# Patient Record
Sex: Male | Born: 1955 | Race: Black or African American | Hispanic: No | Marital: Married | Smoking: Former smoker
Health system: Southern US, Community
[De-identification: ages and names within clinical notes are randomized; demographics above are authoritative.]

## PROBLEM LIST (undated history)

## (undated) DIAGNOSIS — E119 Type 2 diabetes mellitus without complications: Secondary | ICD-10-CM

## (undated) DIAGNOSIS — E785 Hyperlipidemia, unspecified: Secondary | ICD-10-CM

## (undated) DIAGNOSIS — I10 Essential (primary) hypertension: Secondary | ICD-10-CM

## (undated) DIAGNOSIS — I252 Old myocardial infarction: Secondary | ICD-10-CM

## (undated) DIAGNOSIS — I2510 Atherosclerotic heart disease of native coronary artery without angina pectoris: Secondary | ICD-10-CM

## (undated) DIAGNOSIS — Z8739 Personal history of other diseases of the musculoskeletal system and connective tissue: Secondary | ICD-10-CM

## (undated) HISTORY — DX: Hyperlipidemia, unspecified: E78.5

## (undated) HISTORY — DX: Old myocardial infarction: I25.2

## (undated) HISTORY — PX: CARDIAC CATHETERIZATION: SHX172

## (undated) HISTORY — DX: Atherosclerotic heart disease of native coronary artery without angina pectoris: I25.10

## (undated) HISTORY — DX: Essential (primary) hypertension: I10

---

## 1999-05-20 ENCOUNTER — Inpatient Hospital Stay (HOSPITAL_COMMUNITY): Admission: AD | Admit: 1999-05-20 | Discharge: 1999-05-23 | Payer: Self-pay | Admitting: *Deleted

## 2001-04-09 ENCOUNTER — Ambulatory Visit (HOSPITAL_COMMUNITY): Admission: RE | Admit: 2001-04-09 | Discharge: 2001-04-10 | Payer: Self-pay | Admitting: *Deleted

## 2001-04-09 ENCOUNTER — Encounter: Payer: Self-pay | Admitting: *Deleted

## 2001-06-20 ENCOUNTER — Ambulatory Visit (HOSPITAL_COMMUNITY): Admission: RE | Admit: 2001-06-20 | Discharge: 2001-06-21 | Payer: Self-pay | Admitting: *Deleted

## 2001-07-21 ENCOUNTER — Observation Stay (HOSPITAL_COMMUNITY): Admission: EM | Admit: 2001-07-21 | Discharge: 2001-07-22 | Payer: Self-pay | Admitting: Emergency Medicine

## 2001-07-21 ENCOUNTER — Encounter: Payer: Self-pay | Admitting: Cardiology

## 2001-07-22 ENCOUNTER — Encounter: Payer: Self-pay | Admitting: Cardiology

## 2002-12-30 ENCOUNTER — Encounter: Payer: Self-pay | Admitting: *Deleted

## 2002-12-30 ENCOUNTER — Emergency Department (HOSPITAL_COMMUNITY): Admission: EM | Admit: 2002-12-30 | Discharge: 2002-12-30 | Payer: Self-pay | Admitting: *Deleted

## 2003-07-11 ENCOUNTER — Ambulatory Visit: Admission: RE | Admit: 2003-07-11 | Discharge: 2003-07-11 | Payer: Self-pay | Admitting: Cardiology

## 2003-12-07 ENCOUNTER — Ambulatory Visit (HOSPITAL_COMMUNITY): Admission: RE | Admit: 2003-12-07 | Discharge: 2003-12-07 | Payer: Self-pay | Admitting: Family Medicine

## 2004-02-08 ENCOUNTER — Ambulatory Visit (HOSPITAL_COMMUNITY): Admission: RE | Admit: 2004-02-08 | Discharge: 2004-02-08 | Payer: Self-pay | Admitting: Family Medicine

## 2004-05-30 ENCOUNTER — Emergency Department (HOSPITAL_COMMUNITY): Admission: EM | Admit: 2004-05-30 | Discharge: 2004-05-30 | Payer: Self-pay | Admitting: Emergency Medicine

## 2004-06-04 ENCOUNTER — Emergency Department (HOSPITAL_COMMUNITY): Admission: EM | Admit: 2004-06-04 | Discharge: 2004-06-04 | Payer: Self-pay | Admitting: Emergency Medicine

## 2005-04-06 ENCOUNTER — Encounter (HOSPITAL_COMMUNITY): Admission: RE | Admit: 2005-04-06 | Discharge: 2005-05-06 | Payer: Self-pay | Admitting: Orthopedic Surgery

## 2005-08-03 ENCOUNTER — Ambulatory Visit: Payer: Self-pay | Admitting: Cardiology

## 2005-10-16 ENCOUNTER — Ambulatory Visit: Payer: Self-pay | Admitting: Cardiology

## 2006-08-14 ENCOUNTER — Ambulatory Visit: Payer: Self-pay | Admitting: Cardiology

## 2006-08-18 ENCOUNTER — Emergency Department (HOSPITAL_COMMUNITY): Admission: EM | Admit: 2006-08-18 | Discharge: 2006-08-18 | Payer: Self-pay | Admitting: Emergency Medicine

## 2006-08-21 ENCOUNTER — Ambulatory Visit: Payer: Self-pay | Admitting: Cardiology

## 2006-08-21 ENCOUNTER — Ambulatory Visit: Payer: Self-pay

## 2007-08-04 ENCOUNTER — Ambulatory Visit: Payer: Self-pay | Admitting: Cardiology

## 2007-09-15 ENCOUNTER — Ambulatory Visit: Payer: Self-pay | Admitting: Cardiology

## 2007-09-15 LAB — CONVERTED CEMR LAB
AST: 31 units/L (ref 0–37)
Albumin: 4.1 g/dL (ref 3.5–5.2)
Alkaline Phosphatase: 49 units/L (ref 39–117)
Cholesterol: 199 mg/dL (ref 0–200)
Direct LDL: 123.6 mg/dL
Total Protein: 7.5 g/dL (ref 6.0–8.3)
VLDL: 45 mg/dL — ABNORMAL HIGH (ref 0–40)

## 2008-08-02 ENCOUNTER — Ambulatory Visit: Payer: Self-pay | Admitting: Cardiology

## 2008-08-25 ENCOUNTER — Ambulatory Visit: Payer: Self-pay | Admitting: Cardiology

## 2008-08-25 ENCOUNTER — Ambulatory Visit: Payer: Self-pay

## 2008-08-25 LAB — CONVERTED CEMR LAB
Alkaline Phosphatase: 42 units/L (ref 39–117)
BUN: 14 mg/dL (ref 6–23)
Bilirubin, Direct: 0.1 mg/dL (ref 0.0–0.3)
CO2: 26 meq/L (ref 19–32)
Direct LDL: 112.6 mg/dL
GFR calc non Af Amer: 95 mL/min
Glucose, Bld: 103 mg/dL — ABNORMAL HIGH (ref 70–99)
HDL: 36.6 mg/dL — ABNORMAL LOW (ref >39.0)
Total Bilirubin: 0.8 mg/dL (ref 0.3–1.2)
Total CHOL/HDL Ratio: 6.2
VLDL: 49 mg/dL — ABNORMAL HIGH (ref 0–40)

## 2009-02-01 ENCOUNTER — Ambulatory Visit: Payer: Self-pay | Admitting: Cardiology

## 2009-02-01 DIAGNOSIS — E785 Hyperlipidemia, unspecified: Secondary | ICD-10-CM

## 2009-02-01 DIAGNOSIS — I10 Essential (primary) hypertension: Secondary | ICD-10-CM

## 2009-02-01 DIAGNOSIS — I2510 Atherosclerotic heart disease of native coronary artery without angina pectoris: Secondary | ICD-10-CM

## 2009-02-03 ENCOUNTER — Ambulatory Visit: Payer: Self-pay

## 2009-03-14 ENCOUNTER — Ambulatory Visit: Payer: Self-pay | Admitting: Cardiology

## 2009-03-14 ENCOUNTER — Encounter: Payer: Self-pay | Admitting: Cardiology

## 2009-10-15 ENCOUNTER — Emergency Department (HOSPITAL_COMMUNITY): Admission: EM | Admit: 2009-10-15 | Discharge: 2009-10-15 | Payer: Self-pay | Admitting: Emergency Medicine

## 2010-02-28 ENCOUNTER — Ambulatory Visit: Payer: Self-pay | Admitting: Cardiology

## 2010-02-28 DIAGNOSIS — I252 Old myocardial infarction: Secondary | ICD-10-CM | POA: Insufficient documentation

## 2011-01-30 NOTE — Assessment & Plan Note (Signed)
Summary: CAD/ANAS      Allergies Added: NKDA  Visit Type:  1 YR F/U Primary Provider:  Dr. Loleta Chance  CC:  no cardaic complaints today.  History of Present Illness: Tony Cannon comes in today for further evaluation and his coronary disease.  Examining her symptoms of angina or ischemia. Denies orthopnea, PND or peripheral edema. He is compliant with his medications. His weight continues to be a problem and he says he needs to lose 50 pounds.  His blood work followed by Dr. Loleta Chance his primary care.  He is not carry nitroglycerin.  Current Medications (verified): 1)  Aspirin Ec 325 Mg Tbec (Aspirin) .... Take One Tablet By Mouth Daily 2)  Metoprolol Succinate 25 Mg Xr24h-Tab (Metoprolol Succinate) .... 1/2 Tab Once Daily 3)  Lisinopril 40 Mg Tabs (Lisinopril) .[name].. 1 Tab Once Daily 4)  Lipitor 80 Mg Tabs (Atorvastatin Calcium) .[name].. 1 Tab Once Daily 5)  Zetia 10 Mg Tabs (Ezetimibe) .[name].. 1 Tab Once Daily 6)  Nitroglycerin 0.4 Mg Subl (Nitroglycerin) .... One Tablet Under Tongue Every 5 Minutes As Needed For Chest Pain---May Repeat Times Three  Allergies (verified): No Known Drug Allergies  Past History:  Past Medical History: Last updated: 02/01/2009 Current Problems:  HYPERTENSION, BENIGN (ICD-401.1) HYPERLIPIDEMIA-MIXED (ICD-272.4) CAD, NATIVE VESSEL (ICD-414.01)  Social History: Last updated: 02/01/2009 Retired  Married  Tobacco Use - Former.  Alcohol Use - yes  Risk Factors: Smoking Status: quit (02/01/2009)  Review of Systems       negative other than history of present illness  Vital Signs:  Patient profile:   55 year old male Height:      72 inches Weight:      269 pounds BMI:     36.61 Pulse rate:   66 / minute Pulse rhythm:   irregular BP sitting:   138 / 80  (left arm) Cuff size:   large  Vitals Entered By: Danielle Rankin, CMA (February 28, 2010 3:48 PM)  Physical Exam  General:  obese.  obese.   Head:  normocephalic and atraumatic Eyes:  PERRLA/EOM  intact; conjunctiva and lids normal. Neck:  Neck supple, no JVD. No masses, thyromegaly or abnormal cervical nodes. Chest [name]:  no deformities or breast masses noted Lungs:  Clear bilaterally to auscultation and percussion. Heart:  Ppoorly appreciated, normal S1-S2, no gallop Abdomen:  Bowel sounds positive; abdomen soft and non-tender without masses, organomegaly, or hernias noted. No hepatosplenomegaly. Msk:  Back normal, normal gait. Muscle strength and tone normal. Pulses:  pulses normal in all 4 extremities Extremities:  No clubbing or cyanosis. Neurologic:  Alert and oriented x 3. Skin:  Intact without lesions or rashes. Psych:  Normal affect.   EKG  Procedure date:  02/28/2010  Findings:      normal sinus rhythm, old inferior [name] infarct, stable  Impression & Recommendations:  Problem # 1:  CAD, NATIVE VESSEL (ICD-414.01) Assessment Unchanged  His updated medication list for this problem includes:    Aspirin Ec 325 Mg Tbec (Aspirin) .[name]... Take one tablet by mouth daily    Metoprolol Succinate 25 Mg Xr24h-tab (Metoprolol succinate) .[name]... 1/2 tab once daily    Lisinopril 40 Mg Tabs (Lisinopril) .[name]... 1 tab once daily    Nitroglycerin 0.4 Mg Subl (Nitroglycerin) ..... One tablet under tongue every 5 minutes as needed for chest pain---may repeat times three  Orders: EKG w/ Interpretation (93000)  Problem # 2:  HYPERTENSION, BENIGN (ICD-401.1) Assessment: Unchanged  His updated medication list for this problem  includes:    Aspirin Ec 325 Mg Tbec (Aspirin) .[name]... Take one tablet by mouth daily    Metoprolol Succinate 25 Mg Xr24h-tab (Metoprolol succinate) .[name]... 1/2 tab once daily    Lisinopril 40 Mg Tabs (Lisinopril) .[name]... 1 tab once daily  Problem # 3:  HYPERLIPIDEMIA-MIXED (ICD-272.4)  His updated medication list for this problem includes:    Lipitor 80 Mg Tabs (Atorvastatin calcium) .[name]... 1 tab once daily    Zetia 10 Mg Tabs (Ezetimibe) .[name]... 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 Take one tablet by mouth daily    Metoprolol Succinate 25 Mg Xr24h-tab (Metoprolol succinate) .[name]... 1/2 tab once daily    Lisinopril 40 Mg Tabs (Lisinopril) .[name]... 1 tab once daily    Nitroglycerin 0.4 Mg Subl (Nitroglycerin) ..... One tablet under tongue every 5 minutes as needed for chest pain---may repeat times three  His updated medication list for this problem includes:    Aspirin Ec 325 Mg Tbec (Aspirin) .[name]... Take one tablet by mouth daily    Metoprolol Succinate 25 Mg Xr24h-tab (Metoprolol succinate) .[name]... 1/2 tab once daily    Lisinopril 40 Mg Tabs (Lisinopril) .[name]... 1 tab once daily    Nitroglycerin 0.4 Mg Subl (Nitroglycerin) ..... One tablet under tongue every 5 minutes as needed for chest pain---may repeat times three  Patient Instructions: 1)  Your physician recommends that you schedule a follow-up appointment in: YEAR WITH DR Cassie Henkels 2)  Your physician recommends that you continue on your current medications as directed. Please refer to the Current Medication list given to you today. Prescriptions: NITROGLYCERIN 0.4 MG SUBL (NITROGLYCERIN) One tablet under tongue every 5 minutes as needed for chest pain---may repeat times three  #25 x 4   Entered by:   Scherrie Bateman, LPN   Authorized by:   Gaylord Shih, MD, [facility]   Signed by:   Scherrie Bateman, LPN on 22/01/5426   Method used:   Electronically to        [facility] (retail)       [address]       [place], [place]  [address]       Ph: [phone]       Fax: [phone]   RxID:   [id number]

## 2011-04-05 ENCOUNTER — Encounter: Payer: Self-pay | Admitting: *Deleted

## 2011-04-09 ENCOUNTER — Ambulatory Visit: Payer: Self-pay | Admitting: Cardiology

## 2011-05-08 ENCOUNTER — Encounter: Payer: Self-pay | Admitting: *Deleted

## 2011-05-10 ENCOUNTER — Ambulatory Visit (INDEPENDENT_AMBULATORY_CARE_PROVIDER_SITE_OTHER): Payer: BC Managed Care – PPO | Admitting: Cardiology

## 2011-05-10 ENCOUNTER — Encounter: Payer: Self-pay | Admitting: Cardiology

## 2011-05-10 VITALS — BP 128/80 | HR 58 | Resp 14 | Ht 72.0 in | Wt 273.0 lb

## 2011-05-10 DIAGNOSIS — I10 Essential (primary) hypertension: Secondary | ICD-10-CM

## 2011-05-10 DIAGNOSIS — Z13220 Encounter for screening for lipoid disorders: Secondary | ICD-10-CM

## 2011-05-10 DIAGNOSIS — I2510 Atherosclerotic heart disease of native coronary artery without angina pectoris: Secondary | ICD-10-CM

## 2011-05-10 DIAGNOSIS — I252 Old myocardial infarction: Secondary | ICD-10-CM

## 2011-05-10 DIAGNOSIS — E785 Hyperlipidemia, unspecified: Secondary | ICD-10-CM

## 2011-05-10 LAB — BASIC METABOLIC PANEL
BUN: 12 mg/dL (ref 6–23)
Chloride: 104 mEq/L (ref 96–112)
Potassium: 4.3 mEq/L (ref 3.5–5.1)
Sodium: 138 mEq/L (ref 135–145)

## 2011-05-10 LAB — HEPATIC FUNCTION PANEL
Alkaline Phosphatase: 49 U/L (ref 39–117)
Bilirubin, Direct: 0 mg/dL (ref 0.0–0.3)
Total Protein: 7.2 g/dL (ref 6.0–8.3)

## 2011-05-10 LAB — LIPID PANEL
Cholesterol: 206 mg/dL — ABNORMAL HIGH (ref 0–200)
Triglycerides: 258 mg/dL — ABNORMAL HIGH (ref 0.0–149.0)

## 2011-05-10 MED ORDER — NITROGLYCERIN 0.4 MG SL SUBL: 0.4000 mg | SUBLINGUAL_TABLET | SUBLINGUAL | Status: DC | PRN

## 2011-05-10 NOTE — Assessment & Plan Note (Signed)
Stable, continue medical therapy. Check lipids today. NTG renewed with education on how to use it.

## 2011-05-10 NOTE — Patient Instructions (Signed)
Your physician recommends that you have lab work today: lipid,liver,bmp Your physician recommends that you schedule a follow-up appointment in:  1 year with Dr. Daleen Squibb

## 2011-05-10 NOTE — Assessment & Plan Note (Signed)
Check lipids today 

## 2011-05-10 NOTE — Assessment & Plan Note (Signed)
Improved. Decrease weight.

## 2011-05-10 NOTE — Progress Notes (Signed)
   Patient ID: Tony Cannon, male    DOB: 1956-02-22, 55 y.o.   MRN: [id number]  HPI Tony Cannon returns today for E and M of his CAD, HL, and HTN. He denies any chest pain or angina. He has occasional indigestion. Weight has been stable, has not lost any. Has not had lipids checked by Tony Cannon because he was not taking his meds when he saw him last. He is out of SL NTG.  EKG today shows NSR with old IMI and occasional PVC.    Review of Systems  All other systems reviewed and are negative.      Physical Exam  Nursing note and vitals reviewed. Constitutional: He is oriented to person, place, and time. He appears well-nourished. No distress.       obese  HENT:  Head: Normocephalic and atraumatic.  Eyes: EOM are normal. Pupils are equal, round, and reactive to light.  Neck: No tracheal deviation present. No thyromegaly present.  Cardiovascular: Normal rate, regular rhythm, S1 normal, S2 normal, normal heart sounds and normal pulses.  PMI is not displaced.     No systolic murmur is present   No diastolic murmur is present  Pulmonary/Chest: Effort normal.  Abdominal: Soft. Bowel sounds are normal. He exhibits no distension. There is no tenderness.  Musculoskeletal: Normal range of motion. He exhibits no edema.  Neurological: He is alert and oriented to person, place, and time.  Skin: Skin is warm and dry.  Psychiatric: He has a normal mood and affect.

## 2011-05-15 NOTE — Assessment & Plan Note (Signed)
[facility]                            CARDIOLOGY OFFICE NOTE   Cannon, Tony Cannon                     MRN:          [id number]  DATE:03/14/2009                            DOB:          Jul 09, 1956    Tony Cannon comes in today for followup.  He was seen in the office by  Tony Cannon on February 01, 2009.  He had some chest discomfort while  shoveling snow and pushing his car.   He had a stress Myoview on February 03, 2009.  This showed an old  inferior wall infarct.  No ischemia, EF 46%.  He has some mild peri-  infarct ischemia but low-risk study.   He has had no further pain.   CURRENT MEDICATIONS:  1. Enteric-coated aspirin 325 mg a day.  2. Toprol-XL 12.5 mg a day.  3. Lisinopril 40 mg a day.  4. Lipitor 80 mg a day.  5. Zetia 10 mg a day.  6. Nitroglycerin p.r.n.   PHYSICAL EXAMINATION:  VITAL SIGNS:  His blood pressure is 130/80 in the  left arm.  His pulse is 72 and regular.  He is 72 inches tall, 262  pounds.  BMI is 35.6.  HEENT:  Normal.  Sclerae are muddy.  NECK:  Supple.  Carotid upstrokes were equal bilaterally without bruits,  no JVD.  Thyroid is not enlarged.  Trachea is midline.  CHEST:  Lungs  are clear to auscultation and percussion.  HEART:  Poorly-appreciated PMI.  Normal S1 and S2.  ABDOMEN:  Protuberant, good bowel sounds.  Soft.  No organomegaly.  It  was difficult to assess.  EXTREMITIES:  No edema.  Pulses were present.  NEURO:  Intact.   ASSESSMENT AND PLAN:  Tony Cannon is stable from our standpoint.  We will  see him back in a year.  I have made no changes in his medical program.     Tony Cannon. Tony Squibb, MD, [facility]  Electronically Signed    TCW/MedQ  DD: 03/14/2009  DT: 03/15/2009  Job #: [id number]   cc:   Tony Cannon. Tony Chance, MD

## 2011-05-15 NOTE — Assessment & Plan Note (Signed)
[facility]                            CARDIOLOGY OFFICE NOTE   NAME:Weedon, NATHANEAL SOMMERS                     MRN:          [id number]  DATE:08/02/2008                            DOB:          May 20, 1956    [name] returns today for further management of the following issues:  1. Coronary artery disease.  He had a previous inferior wall infarct,      where he had heavy pressure on his chest and back and nausea.  He      had a stent placed to the distal right coronary artery on May 28, 1999.  He then had progressive substernal chest pain and      subsequently had stenting of the mid LAD as well as to mid      circumflex.  This was achieved on April 09, 2001.  He then had PTA      and stenting of the distal right.  He is having no angina or      ischemic symptoms.  His last stress Myoview was on August 21, 2006,      showed him to have good exercise tolerance, EF 48%, no ischemia or      inferior wall infarct.  2. Hyperlipidemia, on high-dose Lipitor and Zetia.  His lipids have      not been at goal.  Last year, he was noncompliant with his Zetia.  3. Hypertension.   He is being followed by Dr. Mirna Mires.  He states it has been some  time since he has had blood work.   His current meds are:  1. Enteric-coated aspirin 325 mg a day.  2. Generic Toprol 12.5 mg a day.  3. Lisinopril 40 mg a day.  4. Lipitor 80 mg a day.  5. Zetia 10 mg a day.   PHYSICAL EXAMINATION:  VITAL SIGNS:  His blood pressure today is 125/65.  His pulse is 68 and regular.  His weight is 260.  HEENT:  No change.  NECK:  Carotid upstrokes are equal bilaterally without bruits.  No JVD.  Thyroid is not enlarged.  Trachea is midline.  Neck is supple.  LUNGS:  Clear.  HEART:  Reveals a normal S1 and S2.  Poorly appreciated PMI.  No murmur.  ABDOMEN:  Soft.  Good bowel sounds.  No obvious midline bruit.  No  hepatomegalia.  EXTREMITIES:  No cyanosis, clubbing, or edema.   Pulses are 2+/4+, both  dorsalis pedis and posterior tibial.  NEURO:  Intact.  MUSCULOSKELETAL:  Intact.  SKIN:  Unremarkable.   EKG shows sinus bradycardia at a rate of 56 beats per minute with  inferior Q waves and there has been no change.   I had a nice chat with Mr. Villalpando today.  I have renewed his medications  and set him up for an exercise rest stress Myoview, off his Toprol.  I  have written a note saying that if this is stable, he  can exercise at [facility].  We will check fasting lipids and  comprehensive metabolic panel.  I will  see him back in a year otherwise.     Thomas C. Daleen Squibb, MD, [facility]  Electronically Signed    TCW/MedQ  DD: 08/02/2008  DT: 08/03/2008  Job #: [id number]   cc:   Annia Friendly. Loleta Chance, MD

## 2011-05-15 NOTE — Assessment & Plan Note (Signed)
[facility]                            CARDIOLOGY OFFICE NOTE   NAME:Tony Cannon, Tony Cannon                     MRN:          [id number]  DATE:02/01/2009                            DOB:          [birth year]    PRIMARY CARDIOLOGIST:  Tony Sans. Daleen Squibb, MD, [facility]   PRIMARY CARE PHYSICIAN:  Tony Friendly. Hill, MD   HISTORY OF PRESENT ILLNESS:  Tony Cannon is a very pleasant 55 year old  African American male patient who is added on today for chest pain.  He  has a history of coronary artery disease status post inferior wall MI,  treated with stent to the RCA on May 28, 1999.  He then had stenting to  the mid LAD as well as the mid circumflex in 2002.  He had followed up  by PTA and stenting to the distal RCA.  His last stress test was in  August 2009 in which he exercised 9 minutes 15 seconds with the Bruce  protocol.  He had a primary fixed inferior defect with minimal ischemia  in the apical inferior wall similar to prior study, excellent exercise  capacity with no chest pain.  EF was 51%.   The patient states that yesterday he was shoveling snow and then pushed  a car with one of his arms.  He was fine while doing the exertion but  when he got in his car to drive away, he developed an uncomfortable  sensation in his left chest associated with belching, this lasted off  and on all day but was relieved with 2 Pepcid AC last evening.  He had  some more this morning, but mostly belching and not as much of the chest  pain.  He says it feels nothing like when he had his MI or  interventions, but he is very vague about describing his chest pain.  He  denies that it is pressure or heaviness, burning or sharp pain.  He has  no pain with exertion.  He slept fine last night without awakening with  pain.  He does not feel the need to use nitroglycerin.  The patient  states the only reason he is here is that his wife insisted that he  come.   CURRENT MEDICATIONS:  1.  Aspirin 325 mg daily.  2. Toprol 12.5 mg daily.  3. Lisinopril 40 mg daily.  4. Lipitor 80 mg daily.  5. Zetia 10 mg daily.   PHYSICAL EXAM:  GENERAL:  This is a very pleasant 55 year old African  American male in no acute distress.  VITAL SIGNS:  Blood pressure 125/81, pulse 60, weight 267.  NECK:  Without JVD, HJR, bruit, or thyroid enlargement.  LUNGS:  Clear anterior, posterior, and lateral.  HEART:  Regular rate and rhythm with 60 beats per minute.  Normal S1 and  S2.  No murmur, rub, bruit, thrill, or heaves noted.  He is not  sensitive or sore to palpation.  ABDOMEN:  Obese.  Normoactive bowel sounds are heard throughout.  No  organomegaly, masses, lesions, or abnormal tenderness.  EXTREMITIES:  Without cyanosis, clubbing, or edema.  He has good distal pulses.   EKG normal sinus rhythm with inferior Q waves, incomplete right bundle-  branch block.  No acute change from prior tracings.   IMPRESSION:  1. Chest pain after shoveling and pushing the car out of the snow      yesterday associated with belching.  2. Coronary artery disease status post inferior wall myocardial      infarction treated with stenting to the distal right coronary      artery in May 2000.  3. Stenting to the mid left anterior descending artery and mid      circumflex in April 2002.  4. Percutaneous transluminal angioplasty and stenting to the distal      right coronary artery in June 2002.  Stress Myoview in August 2009      showed fixed inferior defect with minimal ischemia in the apical      inferior wall similar to prior study, ejection fraction 51%.  5. Hyperlipidemia.  6. Hypertension.   PLAN:  I had a long discussion with Tony Cannon concerning his chest  pain.  I did offer that he could to go to the hospital for further  evaluation or have an outpatient stress Myoview.  The patient prefers to  go this route.  I asked him that if he has any chest pressure or  tightness similar to his prior MI  that he go directly to the emergency  room.  He has fresh nitroglycerin with him and we will schedule him for  the stress test in the next day or so.  I suspect his pain is most  probably related to his shoveling and pushing the car out of the ditch,  musculoskeletal.  I told him to continue taking the Pepcid for the  belching and indigestion, and he will follow up with Dr. Daleen Cannon in the  next couple of weeks.  He is to call if he has any further problems.      [name], PA-C  Electronically Signed      [name], MD, [facility]  Electronically Signed   ML/MedQ  DD: 02/01/2009  DT: 02/02/2009  Job #: [id number]

## 2011-05-15 NOTE — Assessment & Plan Note (Signed)
[facility]                            CARDIOLOGY OFFICE NOTE   NAME:Tony Cannon, Tony Cannon                     MRN:          [id number]  DATE:08/04/2007                            DOB:          03/03/1956    Tony Cannon returns today for further management of the following issues:  1. Coronary artery disease.  He had a negative stress Myoview August 21, 2006.  He has had previous intervention of the right coronary      artery, and a previous inferior wall infarct.  He is having no      symptoms of angina or ischemia.  2. Mild reduction of left ventricular systolic function.  3. Hypertension.  4. Hyperlipidemia.  We switched him back to Lipitor and Zetia because      he did not have a good response to Vytorin.  He is due lipids.   MEDICATIONS:  1. Aspirin 325 a day.  2. Toprol generic 12.5 mg a day.  3. Lisinopril 40 mg a day.  4. Lipitor 80 mg a day.   He is now off Zetia.   His blood pressure is 122/80.  His pulse is 70 and regular.  His weight  is 257.  HEENT:  Normocephalic and atraumatic.  PERRLA.  Extraocular movements  intact.  Sclerae are clear.  Facial symmetry is normal.  NECK:  Shows good carotid upstroke.  There is no bruit.  Thyroid is not  enlarged.  Trachea is midline.  LUNGS:  Clear.  HEART:  Reveals a regular rate and rhythm, non-displaced, but poorly  appreciated PMI.  ABDOMINAL EXAM:  Protuberant with good bowel sounds.  No obvious  tenderness.  EXTREMITIES:  Reveal no cyanosis, clubbing, or edema.  Pulses are brisk.  NEUROLOGIC:  Exam is intact.   Electrocardiogram showed sinus rhythm with an old inferior wall infarct  pattern.  There has been no change.   ASSESSMENT:  Tony Cannon is doing well.  He is asymptomatic.  He is due  followup lipids and LFTs.  Unfortunately, he is off his Zetia.   PLAN:  1. Renew Zetia 10 mg a day.  2. Continue Lipitor 80 mg a day.  3. Continue other medications.  4. Fasting lipids and  LFTs in about 6 to 8 weeks.  5. Follow up with me in a year.     Thomas C. Daleen Squibb, MD, [facility]  Electronically Signed    TCW/MedQ  DD: 08/04/2007  DT: 08/04/2007  Job #: [id number]   cc:   Annia Friendly. Loleta Chance, MD

## 2011-05-18 NOTE — Cardiovascular Report (Signed)
[place]  Patient:    Tony Cannon, Tony Cannon                    MRN: [id number] Adm. Date:  04/09/01 Attending:  Cecil Cranker, M.D. [facility] CC:         Mirna Mires, M.D.  Daisey Must, M.D. [facility]   Cardiac Catheterization  PROCEDURE:  Selective coronary angiography, left ventricular angiography, and abdominal aortography, LIMA angiography - Judkins technique.  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  He presented in May of 2000 with acute DMI and emergency PTCA stent of the totally occluded RCA.  He now presented approximately two to three weeks ago.  RESULTS:  Pressures; LV systolic 120, diastolic 16.  AO systolic 120, diastolic 80.  ANGIOGRAPHY:  The left main coronary artery is normal.  The left anterior descending has a focal 90 to 95% lesion before the first septal and diagonal.  The circumflex has a 90% focal lesion between the first and second obtuse marginals.  The RCA has 50% segmental lesion proximal and the stent is noted distally with no restenosis.  In the large posterolateral branch there is a 50% lesion and an 80% segmental lesion.  The left ventricle reveals normal EF with slight focal hypokinesis inferior basal segment.  Abdominal aortogram reveals normal renal arteries and no aneurysms.  The LIMA is patent.  The patient has three vessel disease with focal lesions of the LAD and circumflex and somewhat segmental in the posterolateral branch of the RCA. The LV is well preserved.  I have reviewed the patient with Dr. Chales Abrahams.  It is felt that because of his young age and the focality of these lesions that percutaneous intervention would be indicated. DD:  04/09/01 TD:  04/09/01 Job: [id number]

## 2011-05-18 NOTE — Discharge Summary (Signed)
Tony Cannon. [facility]  Patient:    Tony Cannon                       MRN: [id number] Adm. Date:  04/09/01 Disc. Date: 04/10/01 Attending:  [name], M.D. [facility] Dictator:   Gene Serpe, P.A. CC:         Annia Friendly. Loleta Chance, M.D., [place], [place]   Referring Physician Discharge [place]  PROCEDURE:  Stenting of left anterior descending and aortic valve/circumflex, April 09, 2001.  REASON FOR ADMISSION:  [name] is a 55 year old male with a prior history of inferior MI/stent, RCA, and PTCA, acute marginal, May 2000, who is followed by Dr. Loraine Leriche Pulsipher, and who presented to our office recently with exertional chest discomfort.  Recommendation was to proceed with diagnostic coronary angiogram to exclude CAD progression.  Please refer to dictated admission note for full details.  LABORATORY DATA:  Normal CBC at discharge.  Sodium 139, potassium 3.5, glucose 119, BUN 12, creatinine 1.2 at discharge.  Admission EKG:  Sinus bradycardia, 54 beats per minute, no ischemic changes.  Admission CXR:  NAD.  HOSPITAL COURSE:  Patient was admitted for elective coronary angiography, performed on April 09, 2001 by Dr. Cecil Cranker (see cath report for full details), which revealed a severe two-vessel CAD and normal LV function. Specifically, the LMCA was normal and the LIMA was patent; there was 95% focal proximal LAD; 90% focal CFX between OM-1 and OM-3; 50% proximal RCA with no restenosis at prior stent RCA site; 80% PL.  LV function was normal.  Following review of films with Dr. Veneda Melter, Dr. Chales Abrahams proceeded with successful stenting of the 90% mid LAD lesion and the 90% mid AV/CFX lesion, both to 0% residual stenosis.  No noted complications.  FINAL RECOMMENDATIONS:  Plavix x 6 weeks with consideration of elective PCI of the RCA lesion in 4-6 weeks by Dr. Loraine Leriche Pulsipher.  MEDICATION ADJUSTMENTS:  Addition of Plavix and Altace.  Of note,  initial intention was to have increased Toprol to 25 mg q.d. when patient was seen in the office; however, he has continued on 12.5 mg and we will continue at this dose, given that he is being started on Altace here in the hospital.  MEDICATIONS AT DISCHARGE: 1. Plavix 75 mg q.d. (x 6 weeks). 2. Altace 2.5 mg q.d. (x 1 week), then 5 mg q.d. (x 3 weeks), then 10 mg q.d. 3. Aspirin 325 mg q.d. 4. Toprol-XL 12.5 mg q.d. 5. Lipitor 40 mg q.d. 6. Nitrostat as directed.  INSTRUCTIONS:  Refrain from any strenuous activity, heavy lifting or driving x 2 days; maintain low-fat/-cholesterol diet; call the office if there is any swelling/bleeding of groin.  Patient will follow up with Dian Queen, P.A.C. on Monday, April 22nd, at noon.  He will also need to be seen by Dr. Gerri Spore regarding plans to proceed with an elective PCI of the RCA as previously outlined.  DISCHARGE DIAGNOSES: 1. Coronary artery disease progression.    a. Status post stent, left anterior descending and aortic valve/circumflex,       April 09, 2001.    b. Residual 50% proximal right coronary artery; no restenosis at stent       right coronary artery site.    c. Normal left ventricular function.    d. History of acute inferior myocardial infarction/stent, distal right       coronary artery; percutaneous transluminal coronary angioplasty, acute  marginal, May 2000. 2. History of sinus bradycardia. 3. History of sexual dysfunction.    a. On beta blocker. 4. ______ . 5. History of tobacco. DD:  04/10/01 TD:  04/10/01 Job: [id number]

## 2011-05-18 NOTE — Cardiovascular Report (Signed)
[place]. [facility]  Patient:    Tony Cannon, Tony Cannon                     MRN: [id number] Proc. Date: 06/20/01 Adm. Date:  [id number] Attending:  Daisey Must CC:         Mirna Mires, M.D.  Cardiac Catheterization Laboratory   Cardiac Catheterization  PROCEDURES PERFORMED: 1. Left heart catheterization with coronary angiography and left    ventriculography. 2. PTCA with stent placement in the distal right coronary artery.  INDICATIONS:  The patient is a 55 year old male, with a history of previous inferior wall myocardial infarction in May of 2000 treated with primary angioplasty and stent placement to the distal right coronary artery.  Shortly after this procedure, he did have some substernal chest pain.  We did a stress Cardiolite which showed inferior infarct with mild peri-infarct ischemia.  At that point we opted to treat him medically.  However, he represented in April of this  year with unstable angina.  Cardiac catheterization at that time by Dr. Corinda Gubler revealed three-vessel disease with focal 90% stenosis in the mid LAD, 90% stenosis in the left circumflex.  Those were both treated with angioplasty and stent placement.  At that time he also had an 80% stenosis in the distal right coronary artery beyond the previously placed stent.  He was brought back today for treatment of the residual disease in the distal right coronary artery.  Because it has been two months since his last procedure, we opted to perform diagnostic catheterization before proceeding with percutaneous intervention.  CATHETERIZATION PROCEDURE:  A 6 French sheath was placed in the right femoral artery.  Standard Judkins 6 French catheters were utilized.  Contrast was Omnipaque.  There were no complications.  RESULTS:  HEMODYNAMICS:  Left ventricular pressure 116/12.  Aortic pressure 116/78. There was no aortic valve gradient.  LEFT VENTRICULOGRAM:  There is moderate  hypokinesis of the inferior wall, otherwise normal wall motion.  Ejection fraction is calculated at 58%.  There is no mitral regurgitation.  CORONARY ARTERIOGRAPHY:  (Right dominant).  Left main:  Normal.  Left anterior descending:  The left anterior descending artery has a tubular 30% stenosis in the mid vessel.  Following the 30% stenosis there is a stent in the mid vessel which is widely patent with 0% stenosis within the stent. The LAD gives rise to a large first diagonal which has a 50% stenosis at it origin.  Left circumflex:  The left circumflex has a 30% stenosis in the mid vessel. Just beyond this area of stenosis is a stent in the mid circumflex which is widely patent with 0% stenosis within that stent.  The circumflex gives rise to a small OM-1, normal sized OM-2 and a small OM-3.  Right coronary artery:  The right coronary artery has a tubular 50% stenosis proximally.  Following this, there is a diffuse area of ectasia in the proximal to mid vessel.  There is a stent in the distal vessel which extends across the origin of a large acute marginal branch which supplies a portion of the inferior septum.  In the distal portion of this stent, there is a 50% stenosis. Just beyond the stent is a 60% stenosis.  Further down in the distal right coronary artery are tandem 80% stenoses.  The distal right coronary artery gives rise to a large acute marginal which supplies the portion of the inferior septum and a normal sized posterior descending artery  and two small posterolateral branches.  IMPRESSIONS: 1. Left ventricular systolic function in the lower range of normal. 2. Residual one-vessel coronary artery disease as described with    significant 80% stenosis in the distal right coronary artery.  There    are patent stents in the left anterior descending artery and circumflex    artery with no significant residual disease in those vessels.  PLAN:  Percutaneous intervention of  the right coronary artery.  See below.  PERCUTANEOUS TRANSLUMINAL CORONARY ANGIOPLASTY PROCEDURE:  Following completion of diagnostic catheterization, we proceeded with coronary intervention.  The preexisting 6 French sheath in the right femoral artery was exchanged over a wire for a 7 [place] sheath.  Heparin and Integrilin were administered per protocol.  We used a 7 [place] guiding catheter with side holes and a BMW wire.  It had distal lesion 80% stenosis and the distal right coronary artery was dilated with a 3.0 x 15 mm Quantum balloon inflated to 10 and then 12 atmospheres.  This balloon was then positioned across the area of 60% narrowing just beyond the previously placed stent and inflated to 14 atmospheres.  We then deployed at 3.0 x 15 mm NIR stent across the original 80% stenoses and deployed this at 16 atmospheres.  This stent was post-dilated with a 3.5 x 12 mm Quantum balloon inflated to 12 atmospheres in the distal aspect of the stent, 18 atmospheres in the proximal aspect of the stent.  We then positioned this balloon in the area of 50% narrowing within the previously placed stent and inflated that to 6 atmospheres.  Final angiographic images revealed patency of the right coronary artery with 0% residual stenosis at the site of a newly placed stent and TIMI-3 flow.  The lesion area of disease in the proximal vessel remained stable with some slight improvement after balloon inflation.  However, there is still residual 40% narrowing at that area.  We opted not to perform further interventions there as described it did appear stable.  COMPLICATIONS:  None.  RESULTS:  Successful percutaneous transluminal coronary angioplasty with stent placement in the right coronary artery.  Tandem 80% stenoses were reduced to 0% residual with TIMI-3 flow.  PLAN:  Integrilin will be continued for 18 hours.  Plavix will be administered for four weeks. DD:  06/20/01 TD:  06/20/01 Job:  [id number]

## 2011-05-18 NOTE — Cardiovascular Report (Signed)
[place]. [facility]  Patient:    Tony Cannon, Tony Cannon                     MRN: [id number] Proc. Date: 04/09/01 Adm. Date:  [id number] Attending:  Daisey Must CC:         Mirna Mires, M.D.  Daisey Must, M.D. [facility]   Cardiac Catheterization  PROCEDURES PERFORMED: 1. Percutaneous transluminal coronary angioplasty and stenting of the    mid left anterior descending. 2. Percutaneous transluminal coronary angioplasty and stenting of the mid    arteriovenous circumflex.  DIAGNOSIS:  Three-vessel coronary artery disease.  INDICATIONS:  Tony Cannon is a 55 year old, black male with a known history of coronary artery disease, who has previously suffered an inferior wall myocardial infarction and underwent acute intervention with stent placement to the distal RCA on May 20, 1999.  The patient did well in the interim, however, he presents now with progressive substernal chest discomfort occurring with exertion.  The patient underwent cardiac catheterization by Dr. Corinda Gubler showing three-vessel coronary artery disease with focal lesions and well preserved LV function.  He presents now for percutaneous intervention.  TECHNIQUE:  After informed consent was obtained, the patient was brought to the cardiac catheterization lab.  Existing 6 French sheath in the right femoral artery was exchanged over a wire for a 7 [place] sheath.  The patient was given heparin and Integrilin on a weight-adjusted basis to maintain an ACT of greater than 250 seconds.  A 7 [place], Voda left 4 guide catheter was then used to engage the left coronary artery and a 0.014 inch Patriot wire advanced in the distal LAD.  A 2.5 x 10 mm CrossSail balloon was introduced and used to predilate the mid LAD lesion at 8 atmospheres for 45 seconds.  Repeat angiography showed significant improvement in vessel lumen with perhaps 30% residual narrowing.  A 3.0 x 13 mm Penta stent was then introduced  and carefully positioned under cineangiography in the mid LAD.  This was then deployed at 12 atmospheres for 45 seconds.  Repeat angiography performed after the administration of intracoronary nitroglycerin showed an excellent result with no residual stenosis and no evidence of vessel damage.  Attention was then turned to the mid AV circumflex and the Patriot wire was repositioned in the circumflex artery.  The 2.5 x 10 mm CrossSail balloon was reprepped and introduced and used to predilate the lesion at 8 atmospheres for 30 seconds. A 3.0 x 13 mm Penta stent was introduced and carefully positioned under cineangiography in the mid AV circumflex between the first and second marginal branches and deployed at 12 atmospheres for 30 seconds.  There appeared to be a mild residual waste in the proximal segment of the stent and the stent delivery system was brought back slightly and used to further dilate the proximal segment of the stent at 12 atmospheres for 30 seconds.  There now appeared to be complete resolution of this waste.  Repeat angiography was then performed after the administration of intracoronary nitroglycerin showing an excellent result with no residual stenosis, full coverage of the lesion, and no evidence of vessel damage.  Final angiography was performed in various projections confirming these findings and this is deemed acceptable result. The guide catheter was then removed and the sheath secured in position.  The patient was transferred to the holding are in stable condition.  He tolerated the procedure well.  FINAL RESULTS: 1. Successful percutaneous transluminal coronary angioplasty and stenting  of the mid left anterior descending with reduction of a 90% stenosis    to 0% with placement of a 3.0 x 13 mm Penta stent. 2. Successful percutaneous transluminal coronary angioplasty and stenting    of the mid arteriovenous circumflex with focal 90% narrowing to 0%    with  placement of a 3.0 x 13 mm Penta stent.  ASSESSMENT AND PLAN:  Tony Cannon is a 55 year old gentleman with advanced coronary artery disease.  The patient has undergo two-vessel percutaneous intervention.  He will undergo a stage intervention to the distal right coronary artery where he has residual disease of 70-80%. DD:  04/09/01 TD:  04/09/01 Job: [id number]

## 2011-05-18 NOTE — Assessment & Plan Note (Signed)
[facility]                              CARDIOLOGY OFFICE NOTE   NAME:Tony Cannon, Tony Cannon                     MRN:          [id number]  DATE:08/14/2006                            DOB:          12-Feb-1956    [name] returns for further managing of the following issues:  1. Coronary artery disease with a previous inferior wall infarct, multi-      vessel stenting.  His last stress Myoview was January 2005 with good      exercise tolerance, EF of 48%, hyperkinesia of the inferior lateral      wall with no ischemia.  He is having no symptoms of angina.  He is able      to do exactly what he wants to do.  2. Hypertension.  3. Hyperlipidemia.  We switched him to Vytorin from Lipitor and Zetia on      August 03, 2005.  His lipids do not look as if he were taking the drug.      They look remarkably good on Lipitor and Zetia.  He promises me he is      taking them today.   CURRENT MEDICATIONS:  1. He is also on Lisinopril 40 mg a day.  2. Toprol XL 12.5 mg a day.  3. Aspirin 325 a day.   EXAMINATION:  GENERAL:  He is in no acute distress.  VITAL SIGNS:  His Blood pressure is 114/84.  His pulse is 64 and regular.  His EKG shows sinus rhythm and old inferior wall infarct.  Weight is 255.  NECK:  Carotids are full without JVD.  Thyroid is not enlarged.  There is no  bruit.  LUNGS:  Clear.  HEART:  Reveals a regular rate and rhythm.  ABDOMEN:  Protuberant with good bowel sounds.  EXTREMITIES:  No cyanosis, clubbing or edema.  Pulses are brisk.   PLAN:  I had a long talk with [name].  I have suggested that we check  lipids today.  If he is at goal will stay with Vytorin 10/80.  If he is not  we will switch back to Lipitor 80 and Zetia 10.  In addition we will arrange  for an exercise rest stress Myoview off of Toprol.  If this is negative for  ischemia we will see him back in a year.                               Thomas C. Daleen Squibb, MD, [facility]    TCW/MedQ  DD:  08/14/2006  DT:  08/14/2006  Job #:  [id number]   cc:   Annia Friendly. Loleta Chance, MD

## 2011-05-18 NOTE — Discharge Summary (Signed)
[place]. [facility]  Patient:    JAVYN, HAVLIN                     MRN: [id number] Adm. Date:  [id number] Disc. Date: [id number] Attending:  Daisey Must Dictator:   Abelino Derrick, P.A.C. [facility]                  Referring Physician Discharge [place]  DISCHARGE DIAGNOSES: 1. Coronary disease, proactive right coronary artery intervention and stenting    this admission. 2. History of coronary disease with myocardial infarction and acute marginal    stenting in May of 2000 and left anterior descending and circumflex    intervention and stenting in April of 2002. 3. Hyperlipidemia on Lipitor. 4. Ex-smoker. 5. Beta blocker sensitivity.  HISTORY OF PRESENT ILLNESS:  Mr. Mascio is a 55 year old male with coronary disease.  He had a DMI with stent in May of 2000.  In April of 2002, he had relook angiogram after developing chest pain.  He had an LAD and circumflex stenting.  He had a residual 70-80% RCA lesion that was not intervened on.  He has been doing well.  He saw Daisey Must, M.D., in the office in follow-up. Dr. Gerri Spore felt it may be best to proceed with proactive intervention of the RCA.  He was admitted electively for this on June 20, 2001.  HOSPITAL COURSE:  The catheterization done by Daisey Must, M.D., revealed a 50% narrowing in the RCA and a distal 80% narrowing in the RCA.  The circumflex and LAD stents were patent.  He underwent stenting to the distal RCA with reduction of stenosis from 80% to 0%.  The plan is for heparin for four weeks.  He did receive Integrilin.  The patient tolerated the procedure well and was up and ambulating without problems the next day.  His groin was without hematoma.  DISCHARGE MEDICATIONS: 1. Plavix 75 mg a day for four weeks. 2. Coated aspirin q.d. 3. Altace 10 mg a day. 4. Toprol XL 25 mg a day. 5. Lipitor 40 mg a day. 6. Nitroglycerin sublingual p.r.n.  LABORATORY DATA:  Sodium 140, potassium 3.7,  BUN 13, creatinine 1.1.  White count 4.6, hemoglobin 14.3, hematocrit 42.5, platelets 304.  The INR was 1.1.  The chest x-ray from April showed no active disease.  The EKG showed sinus rhythm and sinus bradycardia at a rate of 53 with inferior Qs.  DISPOSITION:  The patient is discharged in stable condition and will follow up with Dr. Lenard Simmer on July 07, 2001. DD:  06/21/01 TD:  06/22/01 Job: [id number]

## 2011-07-09 ENCOUNTER — Other Ambulatory Visit: Payer: Self-pay | Admitting: Cardiology

## 2011-10-08 ENCOUNTER — Other Ambulatory Visit: Payer: Self-pay | Admitting: Cardiology

## 2012-01-23 ENCOUNTER — Other Ambulatory Visit: Payer: Self-pay | Admitting: Cardiology

## 2012-06-26 ENCOUNTER — Telehealth: Payer: Self-pay | Admitting: *Deleted

## 2012-06-26 DIAGNOSIS — E785 Hyperlipidemia, unspecified: Secondary | ICD-10-CM

## 2012-06-26 NOTE — Telephone Encounter (Signed)
Pt returned call and will have labs drawn at [facility] in [place] tomorrow. Mylo Red RN

## 2012-06-26 NOTE — Telephone Encounter (Signed)
LMOVM for pt to call office. Due Fasting cholesterol and liver labs. Can have these drawn at [facility] in [place] if ok with pt. Mylo Red RN

## 2012-06-27 ENCOUNTER — Other Ambulatory Visit: Payer: Self-pay | Admitting: Cardiology

## 2012-06-27 LAB — HEPATIC FUNCTION PANEL
ALT: 30 U/L (ref 0–53)
AST: 25 U/L (ref 0–37)
Alkaline Phosphatase: 49 U/L (ref 39–117)
Indirect Bilirubin: 0.4 mg/dL (ref 0.0–0.9)
Total Bilirubin: 0.5 mg/dL (ref 0.3–1.2)

## 2012-06-27 LAB — LIPID PANEL
Total CHOL/HDL Ratio: 5.5 Ratio
VLDL: 60 mg/dL — ABNORMAL HIGH (ref 0–40)

## 2012-07-01 ENCOUNTER — Encounter: Payer: Self-pay | Admitting: *Deleted

## 2012-07-02 ENCOUNTER — Ambulatory Visit (INDEPENDENT_AMBULATORY_CARE_PROVIDER_SITE_OTHER): Payer: BC Managed Care – PPO | Admitting: Cardiology

## 2012-07-02 ENCOUNTER — Encounter: Payer: Self-pay | Admitting: Cardiology

## 2012-07-02 VITALS — BP 138/82 | HR 62 | Ht 72.0 in | Wt 281.0 lb

## 2012-07-02 DIAGNOSIS — I252 Old myocardial infarction: Secondary | ICD-10-CM

## 2012-07-02 DIAGNOSIS — I2510 Atherosclerotic heart disease of native coronary artery without angina pectoris: Secondary | ICD-10-CM

## 2012-07-02 DIAGNOSIS — E785 Hyperlipidemia, unspecified: Secondary | ICD-10-CM

## 2012-07-02 DIAGNOSIS — I10 Essential (primary) hypertension: Secondary | ICD-10-CM

## 2012-07-02 MED ORDER — EZETIMIBE 10 MG PO TABS: 10.0000 mg | ORAL_TABLET | Freq: Every day | ORAL | Status: DC

## 2012-07-02 NOTE — Patient Instructions (Addendum)
Your physician recommends that you continue on your current medications as directed. Please refer to the Current Medication list given to you today.  Your physician wants you to follow-up in: 1 year. You will receive a reminder letter in the mail two months in advance. If you don't receive a letter, please call our office to schedule the follow-up appointment.  Your physician recommends that you return for fasting lab work in: 1 year.

## 2012-07-02 NOTE — Progress Notes (Signed)
HPI Tony Cannon returns today for evaluation and management of his coronary disease, history of MI, hypertension, mixed hyperlipidemia.  Having a lot of lower back pain and has not been walking or exercising. His weight is increased substantially. His recent blood work showed a cholesterol then increase to 216, triglycerides 02/02/2001, HDL 39, LDL 117. Remarkably his VLDL is 60. This indicates dietary indiscretion as he and I discussed today. He also was off of Zetia: he had a run out of it.  Is not having any angina or ischemic symptoms. Past Medical History  Diagnosis Date  . Essential hypertension, benign   . Other and unspecified hyperlipidemia   . Coronary atherosclerosis of native coronary artery   . Old myocardial infarction     Current Outpatient Prescriptions  Medication Sig Dispense Refill  . aspirin 325 MG tablet Take 325 mg by mouth daily.        [name] LIPITOR 80 MG tablet TAKE ONE TABLET BY MOUTH EVERY DAY  30 each  11  . lisinopril (PRINIVIL,ZESTRIL) 40 MG tablet TAKE ONE TABLET BY MOUTH EVERY DAY  30 tablet  5  . metoprolol succinate (TOPROL-XL) 25 MG 24 hr tablet TAKE ONE-HALF BY MOUTH EVERY DAY  30 tablet  6  . nitroGLYCERIN (NITROSTAT) 0.4 MG SL tablet Place 1 tablet (0.4 mg total) under the tongue every 5 (five) minutes as needed.  25 tablet  11  . ZETIA 10 MG tablet TAKE ONE TABLET BY MOUTH EVERY DAY  30 each  5  . ezetimibe (ZETIA) 10 MG tablet Take 1 tablet (10 mg total) by mouth daily.  30 tablet  12    No Known Allergies  No family history on file.  History   Social History  . Marital Status: Married    Spouse Name: N/A    Number of Children: N/A  . Years of Education: N/A   Occupational History  . retired    Social History Main Topics  . Smoking status: Former Games developer  . Smokeless tobacco: Not on file  . Alcohol Use: Yes  . Drug Use: Not on file  . Sexually Active: Not on file   Other Topics Concern  . Not on file   Social History Narrative  . No  narrative on file    ROS ALL NEGATIVE EXCEPT THOSE NOTED IN HPI  PE  General Appearance: well developed, well nourished in no acute distress muscular, obese HEENT: symmetrical face, PERRLA, good dentition  Neck: no JVD, thyromegaly, or adenopathy, trachea midline Chest: symmetric without deformity Cardiac: PMI non-displaced, RRR, normal S1, S2, no gallop or murmur Lung: clear to ausculation and percussion Vascular: all pulses full without bruits  Abdominal: nondistended, nontender, good bowel sounds, no HSM, no bruits Extremities: no cyanosis, clubbing or edema, no sign of DVT, no varicosities  Skin: normal color, no rashes Neuro: alert and oriented x 3, non-focal Pysch: normal affect  EKG Normal sinus rhythm, old inferior [name] infarct, occasional PVC BMET    Component Value Date/Time   NA 138 05/10/2011 1033   K 4.3 05/10/2011 1033   CL 104 05/10/2011 1033   CO2 27 05/10/2011 1033   GLUCOSE 100* 05/10/2011 1033   BUN 12 05/10/2011 1033   CREATININE 1.0 05/10/2011 1033   CALCIUM 9.7 05/10/2011 1033   GFRNONAA 95 08/25/2008 1050   GFRAA 115 08/25/2008 1050    Lipid Panel     Component Value Date/Time   CHOL 216* 06/27/2012 0813   TRIG 302* 06/27/2012 0813  HDL 39* 06/27/2012 0813   CHOLHDL 5.5 06/27/2012 0813   VLDL 60* 06/27/2012 0813   LDLCALC 117* 06/27/2012 0813    CBC No results found for this basename: wbc, rbc, hgb, hct, plt, mcv, mch, mchc, rdw, neutrabs, lymphsabs, monoabs, eosabs, basosabs

## 2012-07-02 NOTE — Assessment & Plan Note (Signed)
Stable. Continue aggressive secondary preventive therapy including increasing exercise, restarting his Zetia, and losing weight.

## 2012-08-18 ENCOUNTER — Other Ambulatory Visit: Payer: Self-pay | Admitting: Cardiology

## 2012-10-28 ENCOUNTER — Other Ambulatory Visit: Payer: Self-pay | Admitting: Cardiology

## 2013-04-11 ENCOUNTER — Other Ambulatory Visit: Payer: Self-pay | Admitting: Cardiology

## 2013-07-08 ENCOUNTER — Telehealth: Payer: Self-pay | Admitting: *Deleted

## 2013-07-08 NOTE — Telephone Encounter (Signed)
Called pt this morning about fasting cholesterol labs being due. He has not had them checked this year & no pcp at this time listed. He will come in fasting tomorrow morning Mylo Red RN

## 2013-07-09 ENCOUNTER — Encounter: Payer: Self-pay | Admitting: Cardiology

## 2013-07-09 ENCOUNTER — Ambulatory Visit (INDEPENDENT_AMBULATORY_CARE_PROVIDER_SITE_OTHER): Payer: BC Managed Care – PPO | Admitting: Cardiology

## 2013-07-09 VITALS — BP 146/88 | HR 62 | Ht 72.0 in | Wt 277.0 lb

## 2013-07-09 DIAGNOSIS — I252 Old myocardial infarction: Secondary | ICD-10-CM

## 2013-07-09 DIAGNOSIS — I2510 Atherosclerotic heart disease of native coronary artery without angina pectoris: Secondary | ICD-10-CM

## 2013-07-09 DIAGNOSIS — E785 Hyperlipidemia, unspecified: Secondary | ICD-10-CM

## 2013-07-09 LAB — HEPATIC FUNCTION PANEL
ALT: 26 U/L (ref 0–53)
AST: 22 U/L (ref 0–37)
Albumin: 4.3 g/dL (ref 3.5–5.2)
Alkaline Phosphatase: 49 U/L (ref 39–117)
Bilirubin, Direct: 0 mg/dL (ref 0.0–0.3)
Total Bilirubin: 0.7 mg/dL (ref 0.3–1.2)
Total Protein: 8.1 g/dL (ref 6.0–8.3)

## 2013-07-09 LAB — LDL CHOLESTEROL, DIRECT: Direct LDL: 235.4 mg/dL

## 2013-07-09 LAB — LIPID PANEL
HDL: 41.9 mg/dL (ref >39.00)
Total CHOL/HDL Ratio: 8

## 2013-07-09 NOTE — Assessment & Plan Note (Signed)
We will check fasting lipids today on no medication. He is also not taking the Zetia. He will need to go on a statin depending on his values. We will followup with him. I've told him to write [facility] and shared this miserable story with them. In my opinion, they should reimburse him for all the expenses for changing this drug. I'll arrange for him to followup in a year in [place].

## 2013-07-09 NOTE — Assessment & Plan Note (Signed)
Stable. Continue current medications to control blood pressure and we will add a statin after blood work.

## 2013-07-09 NOTE — Progress Notes (Signed)
HPI Is in today for evaluation and management of his coronary artery disease. [employer] made him change to generic Lipitor. He broke out in a rash over his chest and back. Dr. Loleta Chance placed him on prednisone. He got better. He then came back. He then went to a dermatologist who figured out. Unfortunately, he is not on a statin for a month and a half.  He is having no chest pain or discomfort. He is fasting today for blood work. He will need to go back on a statin. He says he's been eating very heart healthy.  Past Medical History  Diagnosis Date  . Essential hypertension, benign   . Other and unspecified hyperlipidemia   . Coronary atherosclerosis of native coronary artery   . Old myocardial infarction     Current Outpatient Prescriptions  Medication Sig Dispense Refill  . aspirin 325 MG tablet Take 325 mg by mouth daily.        [name] lisinopril (PRINIVIL,ZESTRIL) 40 MG tablet TAKE ONE TABLET BY MOUTH EVERY DAY  30 tablet  5  . metoprolol succinate (TOPROL-XL) 25 MG 24 hr tablet TAKE ONE-HALF TABLET BY MOUTH EVERY DAY  30 tablet  5  . nitroGLYCERIN (NITROSTAT) 0.4 MG SL tablet Place 1 tablet (0.4 mg total) under the tongue every 5 (five) minutes as needed.  25 tablet  11  . atorvastatin (LIPITOR) 80 MG tablet TAKE ONE TABLET BY MOUTH EVERY DAY  30 tablet  11  . ezetimibe (ZETIA) 10 MG tablet Take 1 tablet (10 mg total) by mouth daily.  30 tablet  12   No current facility-administered medications for this visit.    No Known Allergies  No family history on file.  History   Social History  . Marital Status: Married    Spouse Name: N/A    Number of Children: N/A  . Years of Education: N/A   Occupational History  . retired    Social History Main Topics  . Smoking status: Former Smoker    Quit date: 08/20/1999  . Smokeless tobacco: Not on file  . Alcohol Use: Yes  . Drug Use: Not on file  . Sexually Active: Not on file   Other Topics Concern  . Not on file   Social  History Narrative  . No narrative on file    ROS ALL NEGATIVE EXCEPT THOSE NOTED IN HPI  PE  General Appearance: well developed, well nourished in no acute distress, obese. HEENT: symmetrical face, PERRLA, good dentition  Neck: no JVD, thyromegaly, or adenopathy, trachea midline Chest: symmetric without deformity Cardiac: PMI non-displaced, RRR, normal S1, S2, no gallop or murmur Lung: clear to ausculation and percussion Vascular: all pulses full without bruits  Abdominal: nondistended, nontender, good bowel sounds,  Extremities: no cyanosis, clubbing or edema, no sign of DVT, no varicosities  Skin: normal color, no rashes Neuro: alert and oriented x 3, non-focal Pysch: normal affect  EKG Normal sinus rhythm, old inferior Kalaya Infantino infarct. BMET    Component Value Date/Time   NA 138 05/10/2011 1033   K 4.3 05/10/2011 1033   CL 104 05/10/2011 1033   CO2 27 05/10/2011 1033   GLUCOSE 100* 05/10/2011 1033   BUN 12 05/10/2011 1033   CREATININE 1.0 05/10/2011 1033   CALCIUM 9.7 05/10/2011 1033   GFRNONAA 95 08/25/2008 1050   GFRAA 115 08/25/2008 1050    Lipid Panel     Component Value Date/Time   CHOL 216* 06/27/2012 0813   TRIG  302* 06/27/2012 0813   HDL 39* 06/27/2012 0813   CHOLHDL 5.5 06/27/2012 0813   VLDL 60* 06/27/2012 0813   LDLCALC 117* 06/27/2012 0813    CBC No results found for this basename: wbc, rbc, hgb, hct, plt, mcv, mch, mchc, rdw, neutrabs, lymphsabs, monoabs, eosabs, basosabs

## 2013-07-09 NOTE — Patient Instructions (Addendum)
You will need cholesterol labs drawn today: lipid, lfts   We will call you with your results  Call our office back with the name of the cholesterol medication that caused your allergic reaction.  Your physician wants you to follow-up in: 1 year with Dr. Darl Householder in our [place] office.  You will receive a reminder letter in the mail two months in advance. If you don't receive a letter, please call our office to schedule the follow-up appointment.

## 2013-07-10 ENCOUNTER — Encounter: Payer: Self-pay | Admitting: *Deleted

## 2013-07-10 ENCOUNTER — Telehealth: Payer: Self-pay | Admitting: *Deleted

## 2013-07-10 DIAGNOSIS — E785 Hyperlipidemia, unspecified: Secondary | ICD-10-CM

## 2013-07-10 MED ORDER — SIMVASTATIN 40 MG PO TABS: 40.0000 mg | ORAL_TABLET | Freq: Every day | ORAL | Status: DC

## 2013-07-10 NOTE — Telephone Encounter (Signed)
I spoke with pt & he will start Siimvastatin 40mg  qhs. He will go to the [facility] lab in [place] the end of August for fasting cholesterol labs  Lab orders and results of current cholesterol panel mailed to pt. Mylo Red RN

## 2013-07-10 NOTE — Addendum Note (Signed)
Addended by: Lisabeth Devoid F on: 07/10/2013 02:10 PM   Modules accepted: Orders

## 2013-09-28 ENCOUNTER — Other Ambulatory Visit: Payer: Self-pay | Admitting: Cardiology

## 2013-11-05 ENCOUNTER — Other Ambulatory Visit: Payer: Self-pay

## 2014-02-03 ENCOUNTER — Other Ambulatory Visit: Payer: Self-pay | Admitting: Cardiology

## 2014-05-25 ENCOUNTER — Other Ambulatory Visit: Payer: Self-pay | Admitting: Cardiology

## 2014-08-10 ENCOUNTER — Other Ambulatory Visit: Payer: Self-pay

## 2014-08-10 MED ORDER — LISINOPRIL 40 MG PO TABS: ORAL_TABLET | ORAL | Status: DC

## 2014-09-08 ENCOUNTER — Ambulatory Visit (INDEPENDENT_AMBULATORY_CARE_PROVIDER_SITE_OTHER): Payer: BC Managed Care – PPO | Admitting: Cardiology

## 2014-09-08 ENCOUNTER — Encounter: Payer: Self-pay | Admitting: Cardiology

## 2014-09-08 VITALS — BP 150/88 | HR 86 | Ht 73.0 in | Wt 281.0 lb

## 2014-09-08 DIAGNOSIS — I2510 Atherosclerotic heart disease of native coronary artery without angina pectoris: Secondary | ICD-10-CM

## 2014-09-08 DIAGNOSIS — E785 Hyperlipidemia, unspecified: Secondary | ICD-10-CM

## 2014-09-08 DIAGNOSIS — R0989 Other specified symptoms and signs involving the circulatory and respiratory systems: Secondary | ICD-10-CM

## 2014-09-08 DIAGNOSIS — I10 Essential (primary) hypertension: Secondary | ICD-10-CM

## 2014-09-08 MED ORDER — METOPROLOL SUCCINATE ER 25 MG PO TB24: 25.0000 mg | ORAL_TABLET | Freq: Every day | ORAL | Status: DC

## 2014-09-08 MED ORDER — NITROGLYCERIN 0.4 MG SL SUBL: 0.4000 mg | SUBLINGUAL_TABLET | SUBLINGUAL | Status: DC | PRN

## 2014-09-08 MED ORDER — LISINOPRIL 40 MG PO TABS: ORAL_TABLET | ORAL | Status: DC

## 2014-09-08 MED ORDER — PRAVASTATIN SODIUM 20 MG PO TABS: 20.0000 mg | ORAL_TABLET | Freq: Every evening | ORAL | Status: DC

## 2014-09-08 NOTE — Patient Instructions (Signed)
Your physician wants you to follow-up in: 1 year You will receive a reminder letter in the mail two months in advance. If you don't receive a letter, please call our office to schedule the follow-up appointment.   Your physician has requested that you have a carotid duplex. This test is an ultrasound of the carotid arteries in your neck. It looks at blood flow through these arteries that supply the brain with blood. Allow one hour for this exam. There are no restrictions or special instructions.    Your physician has recommended you make the following change in your medication:     START Pravastatin 20 mg daily   DECREASE Aspirin to 81 mg daily  STOP Zetia       Thank you for choosing [facility] !

## 2014-09-08 NOTE — Progress Notes (Signed)
Clinical Summary Mr. Dunaj is a 58 y.o.male former patient of Dr Daleen Squibb, this is our first visit together. She is seen today for follow up of the following medical problems.  1. CAD - noted multivessel disease in 2002, due to anatomy of lesions according to notes multivessel PCI was thought best intervention.  -03/2001 stent to LAD and LCX, 05/2001 stent to RCA. Cath with normal LVEF at that time.  - denies any chest pain. Denies any significant DOE. Highest level of activiti is chopping wood with axe, tolerates well. - compliant with meds  2. Hyperlipidemia -  did well on lipitor but rash when he was changed to generic, has not been on statin since  3. HTN - compliant with meds, but ran out recently of lisinopril.       Past Medical History  Diagnosis Date  . Essential hypertension, benign   . Other and unspecified hyperlipidemia   . Coronary atherosclerosis of native coronary artery   . Old myocardial infarction      Allergies  Allergen Reactions  . Atorvastatin Rash     Current Outpatient Prescriptions  Medication Sig Dispense Refill  . aspirin 325 MG tablet Take 325 mg by mouth daily.        [name] atorvastatin (LIPITOR) 80 MG tablet TAKE ONE TABLET BY MOUTH EVERY DAY  30 tablet  4  . ezetimibe (ZETIA) 10 MG tablet Take 1 tablet (10 mg total) by mouth daily.  30 tablet  12  . lisinopril (PRINIVIL,ZESTRIL) 40 MG tablet TAKE ONE TABLET BY MOUTH ONCE DAILY  15 tablet  0  . metoprolol succinate (TOPROL-XL) 25 MG 24 hr tablet TAKE ONE-HALF TABLET BY MOUTH EVERY DAY  30 tablet  6  . nitroGLYCERIN (NITROSTAT) 0.4 MG SL tablet Place 1 tablet (0.4 mg total) under the tongue every 5 (five) minutes as needed.  25 tablet  11  . simvastatin (ZOCOR) 40 MG tablet Take 1 tablet (40 mg total) by mouth at bedtime.  90 tablet  3   No current facility-administered medications for this visit.     Past Surgical History  Procedure Laterality Date  . Cardiac catheterization        Allergies  Allergen Reactions  . Atorvastatin Rash      No family history on file.   Social History Mr. Kats reports that he quit smoking about 15 years ago. He does not have any smokeless tobacco history on file. Mr. Rister reports that he drinks alcohol.   Review of Systems CONSTITUTIONAL: No weight loss, fever, chills, weakness or fatigue.  HEENT: Eyes: No visual loss, blurred vision, double vision or yellow sclerae.No hearing loss, sneezing, congestion, runny nose or sore throat.  SKIN: No rash or itching.  CARDIOVASCULAR: per HPI RESPIRATORY: No shortness of breath, cough or sputum.  GASTROINTESTINAL: No anorexia, nausea, vomiting or diarrhea. No abdominal pain or blood.  GENITOURINARY: No burning on urination, no polyuria NEUROLOGICAL: No headache, dizziness, syncope, paralysis, ataxia, numbness or tingling in the extremities. No change in bowel or bladder control.  MUSCULOSKELETAL: No muscle, back pain, joint pain or stiffness.  LYMPHATICS: No enlarged nodes. No history of splenectomy.  PSYCHIATRIC: No history of depression or anxiety.  ENDOCRINOLOGIC: No reports of sweating, cold or heat intolerance. No polyuria or polydipsia.  [name]   Physical Examination p 86 bp 150/88 Wt 281 lbs BMI 37 Gen: resting comfortably, no acute distress HEENT: no scleral icterus, pupils equal round and reactive, no palptable cervical adenopathy,  CV: RRR, no m/r/g, no JVD, + right carotid bruit Resp: Clear to auscultation bilaterally GI: abdomen is soft, non-tender, non-distended, normal bowel sounds, no hepatosplenomegaly MSK: extremities are warm, no edema.  Skin: warm, no rash Neuro:  no focal deficits Psych: appropriate affect   Diagnostic Studies 09/08/14 EKG NSR, Q-waves inferior and lateral precordial leads    Assessment and Plan 1. CAD - no current symptoms - continue risk factor modification and secondary prevention  2. Hyperlipidemia - rash on generic lipitor,  apparently tolerated regular lipitor - will try pravastatin  daily as mild statin, if tolerated likely titrate up  3. HTN - elevated in clinic but ran out of his lisinopril, will refill today and follow trends  4. Carotid bruit - order carotid US  F/u 1 year    Antoine Poche, M.D., F.A.C.C.

## 2014-09-13 ENCOUNTER — Ambulatory Visit (HOSPITAL_COMMUNITY)
Admission: RE | Admit: 2014-09-13 | Discharge: 2014-09-13 | Disposition: A | Discharge status: Home / Self Care | Payer: BC Managed Care – PPO | Source: Ambulatory Visit | Attending: Cardiology | Admitting: Cardiology

## 2014-09-13 DIAGNOSIS — R0989 Other specified symptoms and signs involving the circulatory and respiratory systems: Secondary | ICD-10-CM | POA: Insufficient documentation

## 2014-09-15 ENCOUNTER — Telehealth: Payer: Self-pay | Admitting: *Deleted

## 2014-09-15 NOTE — Telephone Encounter (Signed)
Notified pt of results, understood. Forwarded to Dr. Loleta Chance

## 2014-09-15 NOTE — Telephone Encounter (Signed)
Message copied by Vernon Prey on Wed Sep 15, 2014  9:14 AM ------      Message from: [place] F      Created: Wed Sep 15, 2014  9:07 AM       Carotid US shows no significant blockages                  Dominga Ferry MD ------

## 2014-10-15 ENCOUNTER — Other Ambulatory Visit: Payer: Self-pay

## 2014-10-23 ENCOUNTER — Encounter (HOSPITAL_COMMUNITY): Payer: Self-pay | Admitting: Emergency Medicine

## 2014-10-23 ENCOUNTER — Emergency Department (HOSPITAL_COMMUNITY)
Admission: EM | Admit: 2014-10-23 | Discharge: 2014-10-23 | Disposition: A | Discharge status: Home / Self Care | Payer: BC Managed Care – PPO | Attending: Emergency Medicine | Admitting: Emergency Medicine

## 2014-10-23 ENCOUNTER — Emergency Department (HOSPITAL_COMMUNITY): Payer: BC Managed Care – PPO

## 2014-10-23 DIAGNOSIS — M25561 Pain in right knee: Secondary | ICD-10-CM | POA: Diagnosis present

## 2014-10-23 DIAGNOSIS — E785 Hyperlipidemia, unspecified: Secondary | ICD-10-CM | POA: Insufficient documentation

## 2014-10-23 DIAGNOSIS — Z79899 Other long term (current) drug therapy: Secondary | ICD-10-CM | POA: Insufficient documentation

## 2014-10-23 DIAGNOSIS — I10 Essential (primary) hypertension: Secondary | ICD-10-CM | POA: Insufficient documentation

## 2014-10-23 DIAGNOSIS — M10072 Idiopathic gout, left ankle and foot: Secondary | ICD-10-CM | POA: Diagnosis not present

## 2014-10-23 DIAGNOSIS — I252 Old myocardial infarction: Secondary | ICD-10-CM | POA: Insufficient documentation

## 2014-10-23 DIAGNOSIS — Z87891 Personal history of nicotine dependence: Secondary | ICD-10-CM | POA: Insufficient documentation

## 2014-10-23 DIAGNOSIS — I2510 Atherosclerotic heart disease of native coronary artery without angina pectoris: Secondary | ICD-10-CM | POA: Insufficient documentation

## 2014-10-23 DIAGNOSIS — Z9889 Other specified postprocedural states: Secondary | ICD-10-CM | POA: Insufficient documentation

## 2014-10-23 DIAGNOSIS — Z7982 Long term (current) use of aspirin: Secondary | ICD-10-CM | POA: Diagnosis not present

## 2014-10-23 DIAGNOSIS — M25461 Effusion, right knee: Secondary | ICD-10-CM | POA: Insufficient documentation

## 2014-10-23 MED ORDER — INDOMETHACIN 25 MG PO CAPS: 25.0000 mg | ORAL_CAPSULE | Freq: Three times a day (TID) | ORAL | Status: DC | PRN

## 2014-10-23 MED ORDER — OXYCODONE-ACETAMINOPHEN 5-325 MG PO TABS: 1.0000 | ORAL_TABLET | ORAL | Status: DC | PRN

## 2014-10-23 MED ORDER — OXYCODONE-ACETAMINOPHEN 5-325 MG PO TABS
1.0000 | ORAL_TABLET | Freq: Once | ORAL | Status: AC
  Administered 2014-10-23: 1 via ORAL
  Filled 2014-10-23: qty 1

## 2014-10-23 NOTE — ED Provider Notes (Signed)
CSN: [id number]     Arrival date & time 10/23/14  0818 History   First MD Initiated Contact with Patient 10/23/14 0830     Chief Complaint  Patient presents with  . Knee Pain     (Consider location/radiation/quality/duration/timing/severity/associated sxs/prior Treatment) Patient is a 58 y.o. male presenting with knee pain. The history is provided by the patient.  Knee Pain Location:  Knee Time since incident:  1 day Injury: no   Knee location:  R knee Pain details:    Quality:  Aching and burning   Radiates to:  Does not radiate   Severity:  Moderate   Duration:  1 day   Timing:  Constant   Progression:  Worsening Chronicity:  New Dislocation: no   Foreign body present:  No foreign bodies Prior injury to area:  No Relieved by:  Nothing Worsened by:  Activity and flexion Ineffective treatments:  None tried Associated symptoms: swelling    Dionne AnoWilliam M Giovanetti is a 58 y.o. male who presents to the ED with right knee pain. The pain started yesterday. [place] and the pain started on the way home while he was driving. He complains of swelling, warmth and pain to the knee. He has a history of gout of his right foot and this feels similar. He denies fever or chills or any other problems. The pain increases with ambulation.   Past Medical History  Diagnosis Date  . Essential hypertension, benign   . Other and unspecified hyperlipidemia   . Coronary atherosclerosis of native coronary artery   . Old myocardial infarction    Past Surgical History  Procedure Laterality Date  . Cardiac catheterization     History reviewed. No pertinent family history. History  Substance Use Topics  . Smoking status: Former Smoker    Quit date: 08/20/1999  . Smokeless tobacco: Never Used  . Alcohol Use: Yes    Review of Systems Negative except as stated in HPI   Allergies  Atorvastatin  Home Medications   Prior to Admission medications   Medication  Sig Start Date End Date Taking? Authorizing Provider  aspirin 81 MG tablet Take 81 mg by mouth daily.    Historical Provider, MD  lisinopril (PRINIVIL,ZESTRIL) 40 MG tablet TAKE ONE TABLET BY MOUTH ONCE DAILY 09/08/14   Antoine PocheJonathan F Branch, MD  metoprolol succinate (TOPROL-XL) 25 MG 24 hr tablet Take 1 tablet (25 mg total) by mouth daily. 09/08/14   Antoine PocheJonathan F Branch, MD  nitroGLYCERIN (NITROSTAT) 0.4 MG SL tablet Place 1 tablet (0.4 mg total) under the tongue every 5 (five) minutes as needed. 09/08/14   Antoine PocheJonathan F Branch, MD  pravastatin (PRAVACHOL) 20 MG tablet Take 1 tablet (20 mg total) by mouth every evening. 09/08/14   Antoine PocheJonathan F Branch, MD   BP 132/84  Pulse 70  Temp(Src) 98.3 F (36.8 C)  Resp 18  Ht 6\' 1"  (1.854 m)  Wt 280 lb (127.007 kg)  BMI 36.95 kg/m2  SpO2 97% Physical Exam  Nursing note and vitals reviewed. Constitutional: He is oriented to person, place, and time. He appears well-developed and well-nourished.  HENT:  Head: Normocephalic and atraumatic.  Eyes: Conjunctivae and EOM are normal.  Neck: Neck supple.  Cardiovascular: Normal rate.   Pulmonary/Chest: Effort normal.  Abdominal: Soft.  Musculoskeletal:       Right knee: He exhibits swelling. He exhibits no ecchymosis, no deformity, no laceration, no erythema and normal patellar mobility. Tenderness found.  Legs: There is tenderness, swelling and warmth of the right anterior knee. Pedal pulses equal, adequate circulation, good touch sensation. Pain with flexion of the knee and with weight bearing.   Neurological: He is alert and oriented to person, place, and time. No cranial nerve deficit.  Skin: Skin is warm and dry.  Psychiatric: He has a normal mood and affect. His behavior is normal.    ED Course  Procedures  Dg Knee Complete 4 Views Right  10/23/2014   CLINICAL DATA:  Right knee pain and swelling around patella. [place]. No specific trauma.  EXAM: RIGHT KNEE -  COMPLETE 4+ VIEW  COMPARISON:  None.  FINDINGS: No fracture. No bone lesion. Knee joint is normally space and aligned. There are no significant arthropathic changes. No joint effusion. There is prepatellar soft tissue swelling.  IMPRESSION: Nonspecific prepatellar soft tissue swelling. No fracture or joint abnormality. No joint effusion.   Electronically Signed   By: Amie Portlandavid  Ormond M.D.   On: 10/23/2014 10:24    MDM  58 y.o. male with right knee pain that started yesterday after walking an extended amount of time. Will treat for pain and inflammation and he will use crutches.  He will follow up with his PCP.  Patient stable for discharge without neurovascular deficits. I have reviewed this patient's vital signs, nurses notes, appropriate labs and imaging.  I discussed findings and plan of care with the patient and he voices understanding    Medication List    TAKE these medications       indomethacin 25 MG capsule  Commonly known as:  INDOCIN  Take 1 capsule (25 mg total) by mouth 3 (three) times daily as needed.     oxyCODONE-acetaminophen 5-325 MG per tablet  Commonly known as:  ROXICET  Take 1 tablet by mouth every 4 (four) hours as needed for severe pain.      ASK your doctor about these medications       aspirin EC 81 MG tablet  Take 81 mg by mouth daily.     lisinopril 40 MG tablet  Commonly known as:  PRINIVIL,ZESTRIL  Take 40 mg by mouth daily.     metoprolol succinate 25 MG 24 hr tablet  Commonly known as:  TOPROL-XL  Take 12.5 mg by mouth daily.     nitroGLYCERIN 0.4 MG SL tablet  Commonly known as:  NITROSTAT  Place 0.4 mg under the tongue every 5 (five) minutes as needed for chest pain.     pravastatin 20 MG tablet  Commonly known as:  PRAVACHOL  Take 20 mg by mouth daily.           [facility] Orlene OchM Neese, [place]NP 10/24/14 [id number]

## 2014-10-23 NOTE — ED Notes (Signed)
Right knee pain/erythema/edema since yesterday.

## 2014-10-26 NOTE — ED Provider Notes (Signed)
Medical screening examination/treatment/procedure(s) were performed by non-physician practitioner and as supervising physician I was immediately available for consultation/collaboration.   EKG Interpretation None       Vanetta MuldersScott Jawan Chavarria, MD 10/26/14 [phone]

## 2015-10-12 ENCOUNTER — Encounter: Payer: Self-pay | Admitting: Cardiology

## 2015-10-12 ENCOUNTER — Ambulatory Visit (INDEPENDENT_AMBULATORY_CARE_PROVIDER_SITE_OTHER): Payer: BC Managed Care – PPO | Admitting: Cardiology

## 2015-10-12 VITALS — BP 136/80 | HR 80 | Ht 72.0 in | Wt 296.0 lb

## 2015-10-12 DIAGNOSIS — Z79899 Other long term (current) drug therapy: Secondary | ICD-10-CM

## 2015-10-12 DIAGNOSIS — R0789 Other chest pain: Secondary | ICD-10-CM

## 2015-10-12 DIAGNOSIS — I10 Essential (primary) hypertension: Secondary | ICD-10-CM

## 2015-10-12 DIAGNOSIS — R0602 Shortness of breath: Secondary | ICD-10-CM

## 2015-10-12 DIAGNOSIS — I2510 Atherosclerotic heart disease of native coronary artery without angina pectoris: Secondary | ICD-10-CM

## 2015-10-12 MED ORDER — PRAVASTATIN SODIUM 40 MG PO TABS: 40.0000 mg | ORAL_TABLET | Freq: Every evening | ORAL | Status: DC

## 2015-10-12 NOTE — Progress Notes (Signed)
Patient ID: Tony Cannon, male   DOB: 11/30/1956, 59 y.o.   MRN: [id number]     Clinical Summary Tony Cannon is a 59 y.o.male seen today for follow up of the following meidcal problems.   1. CAD - noted multivessel disease in 2002, due to anatomy of lesions according to notes multivessel PCI was thought best intervention.  -03/2001 stent to LAD and LCX, 05/2001 stent to RCA. Cath with normal LVEF at that time.    -notes some occasional chest tightness. Started beginning of summer. Notes DOE that is new, for example walking on treadmill. Can get some chest tightness with exercise.   2. Hyperlipidemia - rash on lipitor, tolerating pravastatin well  3. HTN - compliant with meds  4. OSA screen - reports previous sleep study 6 years ago was negative.   Past Medical History  Diagnosis Date  . Essential hypertension, benign   . Other and unspecified hyperlipidemia   . Coronary atherosclerosis of native coronary artery   . Old myocardial infarction      Allergies  Allergen Reactions  . Atorvastatin Rash     Current Outpatient Prescriptions  Medication Sig Dispense Refill  . aspirin EC 81 MG tablet Take 81 mg by mouth daily.    . indomethacin (INDOCIN) 25 MG capsule Take 1 capsule (25 mg total) by mouth 3 (three) times daily as needed. 30 capsule 0  . lisinopril (PRINIVIL,ZESTRIL) 40 MG tablet Take 40 mg by mouth daily.    . metoprolol succinate (TOPROL-XL) 25 MG 24 hr tablet Take 12.5 mg by mouth daily.    . nitroGLYCERIN (NITROSTAT) 0.4 MG SL tablet Place 0.4 mg under the tongue every 5 (five) minutes as needed for chest pain.    [name]. oxyCODONE-acetaminophen (ROXICET) 5-325 MG per tablet Take 1 tablet by mouth every 4 (four) hours as needed for severe pain. 20 tablet 0  . pravastatin (PRAVACHOL) 20 MG tablet Take 20 mg by mouth daily.     No current facility-administered medications for this visit.     Past Surgical History  Procedure Laterality Date  . Cardiac  catheterization       Allergies  Allergen Reactions  . Atorvastatin Rash      No family history on file.   Social History Tony Cannon reports that he quit smoking about 16 years ago. He has never used smokeless tobacco. Tony Cannon reports that he drinks alcohol.   Review of Systems CONSTITUTIONAL: No weight loss, fever, chills, weakness or fatigue.  HEENT: Eyes: No visual loss, blurred vision, double vision or yellow sclerae.No hearing loss, sneezing, congestion, runny nose or sore throat.  SKIN: No rash or itching.  CARDIOVASCULAR: per HPI RESPIRATORY: No shortness of breath, cough or sputum.  GASTROINTESTINAL: No anorexia, nausea, vomiting or diarrhea. No abdominal pain or blood.  GENITOURINARY: No burning on urination, no polyuria NEUROLOGICAL: No headache, dizziness, syncope, paralysis, ataxia, numbness or tingling in the extremities. No change in bowel or bladder control.  MUSCULOSKELETAL: No muscle, back pain, joint pain or stiffness.  LYMPHATICS: No enlarged nodes. No history of splenectomy.  PSYCHIATRIC: No history of depression or anxiety.  ENDOCRINOLOGIC: No reports of sweating, cold or heat intolerance. No polyuria or polydipsia.  [name].   Physical Examination Filed Vitals:   10/12/15 0902  BP: 136/80  Pulse: 80   Filed Vitals:   10/12/15 0902  Height: 6' (1.829 m)  Weight: 296 lb (134.265 kg)    Gen: resting comfortably, no acute distress HEENT: no scleral  icterus, pupils equal round and reactive, no palptable cervical adenopathy,  CV: RRR, no m/r/g, no jvd, right carotid bruit Resp: Clear to auscultation bilaterally GI: abdomen is soft, non-tender, non-distended, normal bowel sounds, no hepatosplenomegaly MSK: extremities are warm, no edema.  Skin: warm, no rash Neuro:  no focal deficits Psych: appropriate affect    Assessment and Plan   1. CAD - recent DOE and chest pain symptoms. Will obtain echo initially, if normal LVEF plan for exercise  cardiolite. If decreased LVEF or new WMA would consider cath.  2. Hyperlipidemia - developed rash on lipitor, tolerating low dose pravastatin. Will try increasing to  daily  3. HTN - at goal, continue current meds  4. Carotid bruit - no significant stenosis by [place] 08/2014   F/u 6 weeks. Pending echo results consider nuclear stress vs cath as described above.  Antoine Poche, M.D.

## 2015-10-12 NOTE — Patient Instructions (Addendum)
Medication Instructions:  Your physician has recommended you make the following change in your medication: 1) INCREASE Pravastatin to 40 mg daily  Labwork: Your physician recommends that you return for lab work for: CMET, CBCD, Magnesium, Fasting lipid panel, TSH, and A1C.   Testing/Procedures: Your physician has requested that you have an echocardiogram. Echocardiography is a painless test that uses sound waves to create images of your heart. It provides your doctor with information about the size and shape of your heart and how well your heart's chambers and valves are working. This procedure takes approximately one hour. There are no restrictions for this procedure.  Follow-Up: Your physician recommends that you schedule a follow-up appointment in: 6 weeks with Dr. Wyline MoodBranch   Any Other Special Instructions Will Be Listed Below (If Applicable). Thank you for choosing [facility]!!

## 2015-10-13 ENCOUNTER — Ambulatory Visit (HOSPITAL_COMMUNITY)
Admission: RE | Admit: 2015-10-13 | Discharge: 2015-10-13 | Disposition: A | Discharge status: Home / Self Care | Payer: BC Managed Care – PPO | Source: Ambulatory Visit | Attending: Cardiology | Admitting: Cardiology

## 2015-10-13 ENCOUNTER — Other Ambulatory Visit (HOSPITAL_COMMUNITY)
Admission: RE | Admit: 2015-10-13 | Discharge: 2015-10-13 | Disposition: A | Discharge status: Home / Self Care | Payer: BC Managed Care – PPO | Source: Ambulatory Visit | Attending: Cardiology | Admitting: Cardiology

## 2015-10-13 DIAGNOSIS — I371 Nonrheumatic pulmonary valve insufficiency: Secondary | ICD-10-CM | POA: Insufficient documentation

## 2015-10-13 DIAGNOSIS — R0602 Shortness of breath: Secondary | ICD-10-CM

## 2015-10-13 DIAGNOSIS — Z955 Presence of coronary angioplasty implant and graft: Secondary | ICD-10-CM | POA: Diagnosis not present

## 2015-10-13 DIAGNOSIS — Z029 Encounter for administrative examinations, unspecified: Secondary | ICD-10-CM | POA: Insufficient documentation

## 2015-10-13 DIAGNOSIS — R0600 Dyspnea, unspecified: Secondary | ICD-10-CM | POA: Insufficient documentation

## 2015-10-13 DIAGNOSIS — I10 Essential (primary) hypertension: Secondary | ICD-10-CM | POA: Insufficient documentation

## 2015-10-13 DIAGNOSIS — I071 Rheumatic tricuspid insufficiency: Secondary | ICD-10-CM | POA: Insufficient documentation

## 2015-10-13 DIAGNOSIS — I2510 Atherosclerotic heart disease of native coronary artery without angina pectoris: Secondary | ICD-10-CM | POA: Diagnosis not present

## 2015-10-13 LAB — COMPREHENSIVE METABOLIC PANEL
ALBUMIN: 4 g/dL (ref 3.5–5.0)
ALT: 28 U/L (ref 17–63)
ANION GAP: 7 (ref 5–15)
AST: 25 U/L (ref 15–41)
Alkaline Phosphatase: 42 U/L (ref 38–126)
BUN: 15 mg/dL (ref 6–20)
CO2: 25 mmol/L (ref 22–32)
Calcium: 9.2 mg/dL (ref 8.9–10.3)
Chloride: 106 mmol/L (ref 101–111)
Creatinine, Ser: 0.92 mg/dL (ref 0.61–1.24)
GFR calc Af Amer: >60 mL/min (ref >60)
GFR calc non Af Amer: >60 mL/min (ref >60)
GLUCOSE: 119 mg/dL — AB (ref 65–99)
POTASSIUM: 4.1 mmol/L (ref 3.5–5.1)
SODIUM: 138 mmol/L (ref 135–145)
Total Bilirubin: 0.5 mg/dL (ref 0.3–1.2)
Total Protein: 7.2 g/dL (ref 6.5–8.1)

## 2015-10-13 LAB — TSH: TSH: 0.758 u[IU]/mL (ref 0.350–4.500)

## 2015-10-13 LAB — MAGNESIUM: Magnesium: 2 mg/dL (ref 1.7–2.4)

## 2015-10-14 LAB — HEMOGLOBIN A1C
HEMOGLOBIN A1C: 7.1 % — AB (ref 4.8–5.6)
MEAN PLASMA GLUCOSE: 157 mg/dL

## 2015-10-17 ENCOUNTER — Other Ambulatory Visit: Payer: Self-pay

## 2015-10-17 ENCOUNTER — Telehealth: Payer: Self-pay

## 2015-10-17 DIAGNOSIS — R0600 Dyspnea, unspecified: Secondary | ICD-10-CM

## 2015-10-17 NOTE — Telephone Encounter (Signed)
Spoke to pt and let him know the results to his echo/lab work. I sent a copy to his pcp. Sent Dahlia ClientHannah the exercise cardiolite to schedule.

## 2015-10-17 NOTE — Telephone Encounter (Signed)
-----   Message from Antoine PocheJonathan F Branch, MD sent at 10/17/2015  9:42 AM EDT ----- Echo looks ok. Please order stress test: exercise cardiolite if can run on treadmill, if not lexiscan. If exercises will need to hold metoprolol day of the test.   Dominga FerryJ Branch MD

## 2015-10-20 ENCOUNTER — Encounter (HOSPITAL_COMMUNITY)
Admission: RE | Admit: 2015-10-20 | Discharge: 2015-10-20 | Disposition: A | Discharge status: Home / Self Care | Payer: BC Managed Care – PPO | Source: Ambulatory Visit | Attending: Cardiology | Admitting: Cardiology

## 2015-10-20 ENCOUNTER — Inpatient Hospital Stay (HOSPITAL_COMMUNITY): Admission: RE | Admit: 2015-10-20 | Payer: BC Managed Care – PPO | Source: Ambulatory Visit

## 2015-10-20 ENCOUNTER — Encounter (HOSPITAL_COMMUNITY): Payer: Self-pay

## 2015-10-20 DIAGNOSIS — R0600 Dyspnea, unspecified: Secondary | ICD-10-CM | POA: Diagnosis not present

## 2015-10-20 DIAGNOSIS — R9439 Abnormal result of other cardiovascular function study: Secondary | ICD-10-CM | POA: Insufficient documentation

## 2015-10-20 LAB — NM MYOCAR MULTI W/SPECT W/WALL MOTION / EF
CHL CUP MPHR: 163 {beats}/min
CHL CUP NUCLEAR SDS: 6
CHL CUP RESTING HR STRESS: 64 {beats}/min
CSEPEW: 11.6 METS
CSEPHR: 85 %
Exercise duration (min): 10 min
Exercise duration (sec): 10 s
LV dias vol: 101 mL
LVSYSVOL: 54 mL
Peak HR: 139 {beats}/min
RATE: 0.33
RPE: 17
SRS: 9
SSS: 14
TID: 0.82

## 2015-10-20 MED ORDER — TECHNETIUM TC 99M SESTAMIBI - CARDIOLITE
10.0000 | Freq: Once | INTRAVENOUS | Status: AC | PRN
Start: 2015-10-20 — End: 2015-10-20
  Administered 2015-10-20: 9.3 via INTRAVENOUS

## 2015-10-20 MED ORDER — REGADENOSON 0.4 MG/5ML IV SOLN
INTRAVENOUS | Status: AC
  Filled 2015-10-20: qty 5

## 2015-10-20 MED ORDER — SODIUM CHLORIDE 0.9 % IJ SOLN
INTRAMUSCULAR | Status: AC
  Administered 2015-10-20: 10 mL via INTRAVENOUS
  Filled 2015-10-20: qty 3

## 2015-10-20 MED ORDER — TECHNETIUM TC 99M SESTAMIBI GENERIC - CARDIOLITE
30.0000 | Freq: Once | INTRAVENOUS | Status: AC | PRN
  Administered 2015-10-20: 28 via INTRAVENOUS

## 2015-10-24 ENCOUNTER — Telehealth: Payer: Self-pay | Admitting: *Deleted

## 2015-10-24 MED ORDER — ISOSORBIDE MONONITRATE ER 30 MG PO TB24: 30.0000 mg | ORAL_TABLET | Freq: Every day | ORAL | Status: DC

## 2015-10-24 NOTE — Telephone Encounter (Signed)
Notes Recorded by Lesle ChrisAngela G Hill, LPN on 16/10/960410/24/2016 at 5:32 PM Patient notified. [facility] [place] follow [place] with Dr. Wyline MoodBranch. Will forward to MD to make sure 11/28/15 is not too far out.

## 2015-10-24 NOTE — Telephone Encounter (Signed)
-----   Message from Laqueta LindenSuresh A Koneswaran, MD sent at 10/20/2015  5:21 PM EDT ----- Dr. Verna CzechBranch's patient. I reviewed his office note. Would initially try to optimize medical therapy. Could start with Imdur 30 mg. If optimal medical therapy is ineffective, would proceed with coronary angiography. Will defer to Dr. Wyline MoodBranch once he returns.

## 2015-10-25 ENCOUNTER — Other Ambulatory Visit: Payer: Self-pay | Admitting: Cardiology

## 2015-10-25 ENCOUNTER — Other Ambulatory Visit: Payer: Self-pay

## 2015-10-25 MED ORDER — LISINOPRIL 40 MG PO TABS: 40.0000 mg | ORAL_TABLET | Freq: Every day | ORAL | Status: DC

## 2015-11-04 NOTE — Telephone Encounter (Signed)
Dr. Branch, please aWyline Mooddvise if patient okay to wait till 11/28/15 for follow up since Dr. Purvis SheffieldKoneswaran was deferring final decision to you.

## 2015-11-07 NOTE — Telephone Encounter (Signed)
Please contact patient and see how his symptoms are doing since starting imdur. If continued chest pain or SOB then we can have him come and see me earlier  Dominga FerryJ Simone Rodenbeck MD

## 2015-11-07 NOTE — Telephone Encounter (Signed)
Discussed below with patient.  Stated he is doing pretty good except for headaches occasionally.  Explained that is a common side effect with the Imdur.  Should go away with time.  If persisting & can't tolerate, contact office and MD may choose to cut dose in half.  Patient verbalized understanding.

## 2015-11-21 ENCOUNTER — Telehealth: Payer: Self-pay | Admitting: Cardiology

## 2015-11-21 NOTE — Telephone Encounter (Signed)
Pt states that for past 3 days he has had a headache nonstop. He asked if he could start taking only half of his medication as it was told to him by another nurse that it could be an option. Please advise.

## 2015-11-21 NOTE — Telephone Encounter (Signed)
Patient would like to discuss side effects of new medication. / tg

## 2015-11-22 NOTE — Telephone Encounter (Signed)
PT made aware, her understood instructions. He said he will call and let us know either way.

## 2015-11-22 NOTE — Telephone Encounter (Signed)
Headache is probably coming from imdur. He can stop for 3 days and see how symptoms go, could try restarting 15mg  daily after that to see if tolerated. If not then ok to stop   J Kenta Laster MD

## 2015-11-28 ENCOUNTER — Encounter: Payer: Self-pay | Admitting: Cardiology

## 2015-11-28 ENCOUNTER — Ambulatory Visit (INDEPENDENT_AMBULATORY_CARE_PROVIDER_SITE_OTHER): Payer: BC Managed Care – PPO | Admitting: Cardiology

## 2015-11-28 VITALS — BP 130/84 | HR 83 | Ht 72.0 in | Wt 285.0 lb

## 2015-11-28 DIAGNOSIS — I2510 Atherosclerotic heart disease of native coronary artery without angina pectoris: Secondary | ICD-10-CM | POA: Diagnosis not present

## 2015-11-28 DIAGNOSIS — R0789 Other chest pain: Secondary | ICD-10-CM | POA: Diagnosis not present

## 2015-11-28 NOTE — Progress Notes (Signed)
Patient ID: Tony Cannon, male   DOB: 09/23/1956, 59 y.o.   MRN: [id number]     Clinical Summary Tony Cannon is a 59 y.o.male seen today for follow up of the following medical problems.   1. CAD - noted multivessel disease in 2002, due to anatomy of lesions according to notes multivessel PCI was thought best intervention.  -03/2001 stent to LAD and LCX, 05/2001 stent to RCA. Cath with normal LVEF at that time.  - last visit he described some episodes of chest pain - completed echo which showed LVEF 55-60%, grade I diastolic dysfunction - exercise MPI showed inferior scar with mild peri-infarct ischemia, LVEF 47%. Exercised 10 minutes with no EKG changes, Duke treadmill score of 10 consistent with low risk for major cardiac events.  - imdur caused headaches, it was stopped - since last visit no significant symptoms   Past Medical History  Diagnosis Date  . Essential hypertension, benign   . Other and unspecified hyperlipidemia   . Coronary atherosclerosis of native coronary artery   . Old myocardial infarction      Allergies  Allergen Reactions  . Atorvastatin Rash     Current Outpatient Prescriptions  Medication Sig Dispense Refill  . aspirin EC 81 MG tablet Take 81 mg by mouth daily.    . isosorbide mononitrate (IMDUR) 30 MG 24 hr tablet Take 1 tablet (30 mg total) by mouth daily. 30 tablet 6  . lisinopril (PRINIVIL,ZESTRIL) 40 MG tablet Take 1 tablet (40 mg total) by mouth daily. 30 tablet 11  . metoprolol succinate (TOPROL-XL) 25 MG 24 hr tablet Take 12.5 mg by mouth daily.    . nitroGLYCERIN (NITROSTAT) 0.4 MG SL tablet Place 0.4 mg under the tongue every 5 (five) minutes as needed for chest pain.    . pravastatin (PRAVACHOL) 40 MG tablet Take 1 tablet (40 mg total) by mouth every evening. 30 tablet 3   No current facility-administered medications for this visit.     Past Surgical History  Procedure Laterality Date  . Cardiac catheterization       Allergies    Allergen Reactions  . Atorvastatin Rash      No family history on file.   Social History Tony Cannon reports that he quit smoking about 16 years ago. He has never used smokeless tobacco. Tony Cannon reports that he drinks alcohol.   Review of Systems CONSTITUTIONAL: No weight loss, fever, chills, weakness or fatigue.  HEENT: Eyes: No visual loss, blurred vision, double vision or yellow sclerae.No hearing loss, sneezing, congestion, runny nose or sore throat.  SKIN: No rash or itching.  CARDIOVASCULAR: per HPI RESPIRATORY: No shortness of breath, cough or sputum.  GASTROINTESTINAL: No anorexia, nausea, vomiting or diarrhea. No abdominal pain or blood.  GENITOURINARY: No burning on urination, no polyuria NEUROLOGICAL: No headache, dizziness, syncope, paralysis, ataxia, numbness or tingling in the extremities. No change in bowel or bladder control.  MUSCULOSKELETAL: No muscle, back pain, joint pain or stiffness.  LYMPHATICS: No enlarged nodes. No history of splenectomy.  PSYCHIATRIC: No history of depression or anxiety.  ENDOCRINOLOGIC: No reports of sweating, cold or heat intolerance. No polyuria or polydipsia.  [name].   Physical Examination Filed Vitals:   11/28/15 1458  BP: 130/84  Pulse: 83   Filed Vitals:   11/28/15 1458  Height: 6' (1.829 m)  Weight: 285 lb (129.275 kg)    Gen: resting comfortably, no acute distress HEENT: no scleral icterus, pupils equal round and reactive, no palptable cervical  adenopathy,  CV: RRR, no m/r/g, no jvd Resp: Clear to auscultation bilaterally GI: abdomen is soft, non-tender, non-distended, normal bowel sounds, no hepatosplenomegaly MSK: extremities are warm, no edema.  Skin: warm, no rash Neuro:  no focal deficits Psych: appropriate affect   Diagnostic Studies 10/2015 echo Study Conclusions  - Left ventricle: The cavity size was normal. Wall thickness was increased in a pattern of mild LVH. There was severe focal  basal hypertrophy of the septum. Systolic function was normal. The estimated ejection fraction was in the range of 55% to 60%. There is hypokinesis of the basal-midinferolateral and inferior myocardium. Doppler parameters are consistent with abnormal left ventricular relaxation (grade 1 diastolic dysfunction). - Right atrium: Central venous pressure (est): 3 mm Hg. - Atrial septum: No defect or patent foramen ovale was identified. - Tricuspid valve: There was physiologic regurgitation. - Pulmonary arteries: Systolic pressure could not be accurately estimated. - Pericardium, extracardiac: There was no pericardial effusion.  Impressions:  - Mild LVH with severe septal hypertrophy, LVEF 55-60%. Mid to basal inferior/inferolateral hypokinesis. Grade 1 diastolic dysfunction.  10/2015 Exercise MPI  Blood pressure demonstrated a hypertensive response to exercise.  There was no ST segment deviation noted during stress. Frequent PVC's noted.  Defect 1: There is a medium defect of severe severity present in the basal inferoseptal, basal inferior, basal inferolateral, mid inferior, mid inferolateral, apical inferior and apical lateral location.  Findings consistent with prior myocardial infarction with peri-infarct ischemia. Specifically, there is myocardial scar with a mild degree of peri-infarct ischemia extending from the apex to the base and also involving the inferoseptal and inferolateral walls.  This is an intermediate risk study.  Nuclear stress EF: 47%.  Assessment and Plan   1. CAD - recent DOE and chest pain symptoms now resolved - echo with normal LVEF, grade I diastolic dysfunction. Exercise MPI with primarily inferior scar with mild peri-infarct ischemia, his Duke treadmill score of 10 supports low risk for major cardiac events - continue medical therapy, if recurrent symptoms can try alternative antianginal other than imdur which caused headaches   F/u 6  months.      Antoine Poche, M.D.

## 2015-11-28 NOTE — Patient Instructions (Signed)
Medication Instructions:  Your physician recommends that you continue on your current medications as directed. Please refer to the Current Medication list given to you today.   Labwork: NONE  Testing/Procedures: NONE  Follow-Up: Your physician wants you to follow-up in: 6 MONTHS WITH DR. BRANCH. You will receive a reminder letter in the mail two months in advance. If you don't receive a letter, please call our office to schedule the follow-up appointment.   Any Other Special Instructions Will Be Listed Below (If Applicable).     If you need a refill on your cardiac medications before your next appointment, please call your pharmacy.  Thanks for choosing [facility]!!!     

## 2016-03-20 ENCOUNTER — Other Ambulatory Visit: Payer: Self-pay | Admitting: Cardiology

## 2016-05-15 ENCOUNTER — Other Ambulatory Visit: Payer: Self-pay | Admitting: Cardiology

## 2016-11-01 ENCOUNTER — Other Ambulatory Visit: Payer: Self-pay | Admitting: Cardiology

## 2017-03-13 ENCOUNTER — Other Ambulatory Visit: Payer: Self-pay | Admitting: Cardiology

## 2017-04-16 ENCOUNTER — Telehealth: Payer: Self-pay

## 2017-04-16 NOTE — Telephone Encounter (Signed)
I triaged pt and his daughter will call back with med list and insurance information.

## 2017-04-16 NOTE — Telephone Encounter (Signed)
Pt left Vm that he is ready to schedule screening colonoscopy.

## 2017-04-17 NOTE — Telephone Encounter (Addendum)
Gastroenterology Pre-Procedure Review  Request Date: 04/16/2017 Requesting Physician: Dr. Mirna Mires  PATIENT REVIEW QUESTIONS: The patient responded to the following health history questions as indicated:    First colonoscopy/ screening   1. Diabetes Melitis: no 2. Joint replacements in the past 12 months: no 3. Major health problems in the past 3 months: no 4. Has an artificial valve or MVP: no 5. Has a defibrillator: no 6. Has been advised in past to take antibiotics in advance of a procedure like teeth cleaning: no 7. Family history of colon cancer: no  8. Alcohol Use: Has 2-3 beers nightly 9. History of sleep apnea: no  10. History of coronary artery or other vascular stents placed within the last 12 months: no    MEDICATIONS & ALLERGIES:    Patient reports the following regarding taking any blood thinners:   Plavix? no Aspirin? YES Coumadin? no Brilinta? no Xarelto? no Eliquis? no Pradaxa? no Savaysa? no Effient? no  Patient confirms/reports the following medications:  Current Outpatient Prescriptions  Medication Sig Dispense Refill  . aspirin EC 81 MG tablet Take 81 mg by mouth daily.    . fexofenadine (ALLEGRA) 180 MG tablet Take 180 mg by mouth daily.    [name] lisinopril (PRINIVIL,ZESTRIL) 40 MG tablet TAKE ONE TABLET BY MOUTH ONCE DAILY 30 tablet 11  . metoprolol succinate (TOPROL-XL) 25 MG 24 hr tablet TAKE ONE TABLET BY MOUTH ONCE DAILY 90 tablet 2  . NON FORMULARY Mucinex 1200 mg once daily    . pravastatin (PRAVACHOL) 40 MG tablet TAKE ONE TABLET BY MOUTH IN THE EVENING 30 tablet 6  . metoprolol succinate (TOPROL-XL) 25 MG 24 hr tablet Take 12.5 mg by mouth daily.    . nitroGLYCERIN (NITROSTAT) 0.4 MG SL tablet Place 0.4 mg under the tongue every 5 (five) minutes as needed for chest pain.     No current facility-administered medications for this visit.     Patient confirms/reports the following allergies:  Allergies  Allergen Reactions  . Atorvastatin Rash     No orders of the defined types were placed in this encounter.   AUTHORIZATION INFORMATION Primary Insurance:  ID #:   Group #:  Pre-Cert / Auth required: Pre-Cert / Auth #:   Secondary Insurance:   ID #:   Group #:  Pre-Cert / Auth required:  Pre-Cert / Auth #:   SCHEDULE INFORMATION: Procedure has been scheduled as follows:  Date:            Time:  Location:   This Gastroenterology Pre-Precedure Review Form is being routed to the following provider(s): R. Roetta Sessions, MD

## 2017-04-18 NOTE — Telephone Encounter (Signed)
PT has been scheduled for his colonoscopy on 05/24/2017 at 2:15 pm with Dr. Jena Gauss.

## 2017-04-18 NOTE — Telephone Encounter (Signed)
Appropriate.

## 2017-04-18 NOTE — Telephone Encounter (Signed)
Upon further review, needs Phenergan 25 mg IV on call. Thanks, Doris!

## 2017-04-19 ENCOUNTER — Other Ambulatory Visit: Payer: Self-pay

## 2017-04-19 DIAGNOSIS — Z1211 Encounter for screening for malignant neoplasm of colon: Secondary | ICD-10-CM

## 2017-04-24 ENCOUNTER — Other Ambulatory Visit: Payer: Self-pay

## 2017-04-24 MED ORDER — PEG 3350-KCL-NA BICARB-NACL 420 G PO SOLR: 4000.0000 mL | ORAL | 0 refills | Status: DC

## 2017-04-24 NOTE — Telephone Encounter (Signed)
Rx has been sent to the pharmacy and instructions mailed to pt.  

## 2017-04-24 NOTE — Telephone Encounter (Signed)
Rx sent to pharmacy and instructions have been mailed to pt.

## 2017-05-02 ENCOUNTER — Other Ambulatory Visit: Payer: Self-pay

## 2017-05-20 ENCOUNTER — Telehealth: Payer: Self-pay

## 2017-05-20 NOTE — Telephone Encounter (Addendum)
Pt is calling to move his TCS from this Friday to another day. He would like to have a Monday. I told him the first Monday we had would be in July 9th any time.

## 2017-05-22 NOTE — Telephone Encounter (Signed)
I spoke to pt and he has rescheduled from 05/24/2017 to 07/08/2017 @ 2:15 AM. I have left VM for Eber JonesCarolyn to make the change and Ginger is aware she has the slot available on 05/24/2017.  I am mailing new instructions to pt and he has picked up his prep, but has not opened it.

## 2017-07-08 ENCOUNTER — Encounter (HOSPITAL_COMMUNITY)
Admission: RE | Disposition: A | Discharge status: Home / Self Care | Payer: Self-pay | Source: Ambulatory Visit | Attending: Internal Medicine

## 2017-07-08 ENCOUNTER — Ambulatory Visit (HOSPITAL_COMMUNITY)
Admission: RE | Admit: 2017-07-08 | Discharge: 2017-07-08 | Disposition: A | Discharge status: Home / Self Care | Payer: BLUE CROSS/BLUE SHIELD | Source: Ambulatory Visit | Attending: Internal Medicine | Admitting: Internal Medicine

## 2017-07-08 ENCOUNTER — Encounter (HOSPITAL_COMMUNITY): Payer: Self-pay

## 2017-07-08 DIAGNOSIS — Z1211 Encounter for screening for malignant neoplasm of colon: Secondary | ICD-10-CM | POA: Diagnosis not present

## 2017-07-08 DIAGNOSIS — Z888 Allergy status to other drugs, medicaments and biological substances status: Secondary | ICD-10-CM | POA: Insufficient documentation

## 2017-07-08 DIAGNOSIS — K621 Rectal polyp: Secondary | ICD-10-CM | POA: Diagnosis not present

## 2017-07-08 DIAGNOSIS — Z87891 Personal history of nicotine dependence: Secondary | ICD-10-CM | POA: Insufficient documentation

## 2017-07-08 DIAGNOSIS — K635 Polyp of colon: Secondary | ICD-10-CM | POA: Diagnosis not present

## 2017-07-08 DIAGNOSIS — Z79899 Other long term (current) drug therapy: Secondary | ICD-10-CM | POA: Diagnosis not present

## 2017-07-08 DIAGNOSIS — I2510 Atherosclerotic heart disease of native coronary artery without angina pectoris: Secondary | ICD-10-CM | POA: Diagnosis not present

## 2017-07-08 DIAGNOSIS — I10 Essential (primary) hypertension: Secondary | ICD-10-CM | POA: Diagnosis not present

## 2017-07-08 DIAGNOSIS — Z800 Family history of malignant neoplasm of digestive organs: Secondary | ICD-10-CM | POA: Diagnosis not present

## 2017-07-08 DIAGNOSIS — E785 Hyperlipidemia, unspecified: Secondary | ICD-10-CM | POA: Diagnosis not present

## 2017-07-08 DIAGNOSIS — Z7982 Long term (current) use of aspirin: Secondary | ICD-10-CM | POA: Diagnosis not present

## 2017-07-08 DIAGNOSIS — Z1212 Encounter for screening for malignant neoplasm of rectum: Secondary | ICD-10-CM

## 2017-07-08 DIAGNOSIS — K5730 Diverticulosis of large intestine without perforation or abscess without bleeding: Secondary | ICD-10-CM | POA: Diagnosis not present

## 2017-07-08 DIAGNOSIS — I252 Old myocardial infarction: Secondary | ICD-10-CM | POA: Diagnosis not present

## 2017-07-08 DIAGNOSIS — K6389 Other specified diseases of intestine: Secondary | ICD-10-CM | POA: Diagnosis not present

## 2017-07-08 HISTORY — PX: COLONOSCOPY: SHX5424

## 2017-07-08 HISTORY — PX: POLYPECTOMY: SHX5525

## 2017-07-08 SURGERY — COLONOSCOPY
Anesthesia: Moderate Sedation

## 2017-07-08 MED ORDER — PROMETHAZINE HCL 25 MG/ML IJ SOLN
INTRAMUSCULAR | Status: AC
Start: 2017-07-08 — End: 2017-07-08
  Administered 2017-07-08: 25 mg via INTRAVENOUS
  Filled 2017-07-08: qty 1

## 2017-07-08 MED ORDER — ONDANSETRON HCL 4 MG/2ML IJ SOLN
INTRAMUSCULAR | Status: AC
  Filled 2017-07-08: qty 2

## 2017-07-08 MED ORDER — SODIUM CHLORIDE 0.9 % IV SOLN
INTRAVENOUS | Status: DC
  Administered 2017-07-08: 13:00:00 via INTRAVENOUS

## 2017-07-08 MED ORDER — MEPERIDINE HCL 100 MG/ML IJ SOLN
INTRAMUSCULAR | Status: AC
  Filled 2017-07-08: qty 2

## 2017-07-08 MED ORDER — PROMETHAZINE HCL 25 MG/ML IJ SOLN
25.0000 mg | Freq: Once | INTRAMUSCULAR | Status: AC
  Administered 2017-07-08: 25 mg via INTRAVENOUS

## 2017-07-08 MED ORDER — SODIUM CHLORIDE 0.9% FLUSH
INTRAVENOUS | Status: AC
  Filled 2017-07-08: qty 10

## 2017-07-08 MED ORDER — ONDANSETRON HCL 4 MG/2ML IJ SOLN
INTRAMUSCULAR | Status: DC | PRN
  Administered 2017-07-08: 4 mg via INTRAVENOUS

## 2017-07-08 MED ORDER — MIDAZOLAM HCL 5 MG/5ML IJ SOLN
INTRAMUSCULAR | Status: AC
  Filled 2017-07-08: qty 10

## 2017-07-08 MED ORDER — MIDAZOLAM HCL 5 MG/5ML IJ SOLN
INTRAMUSCULAR | Status: DC | PRN
  Administered 2017-07-08 (×2): 2 mg via INTRAVENOUS

## 2017-07-08 MED ORDER — MEPERIDINE HCL 100 MG/ML IJ SOLN
INTRAMUSCULAR | Status: DC | PRN
  Administered 2017-07-08 (×2): 50 mg via INTRAVENOUS

## 2017-07-08 MED ORDER — STERILE WATER FOR IRRIGATION IR SOLN
Status: DC | PRN
  Administered 2017-07-08: 14:00:00

## 2017-07-08 NOTE — Discharge Instructions (Addendum)
Diverticulosis °Diverticulosis is a condition that develops when small pouches (diverticula) form in the wall of the large intestine (colon). The colon is where water is absorbed and stool is formed. The pouches form when the inside layer of the colon pushes through weak spots in the outer layers of the colon. You may have a few pouches or many of them. °What are the causes? °The cause of this condition is not known. °What increases the risk? °The following factors may make you more likely to develop this condition: °· Being older than age 60. Your risk for this condition increases with age. Diverticulosis is rare among people younger than age 30. By age 80, many people have it. °· Eating a low-fiber diet. °· Having frequent constipation. °· Being overweight. °· Not getting enough exercise. °· Smoking. °· Taking over-the-counter pain medicines, like aspirin and ibuprofen. °· Having a family history of diverticulosis. ° °What are the signs or symptoms? °In most people, there are no symptoms of this condition. If you do have symptoms, they may include: °· Bloating. °· Cramps in the abdomen. °· Constipation or diarrhea. °· Pain in the lower left side of the abdomen. ° °How is this diagnosed? °This condition is most often diagnosed during an exam for other colon problems. Because diverticulosis usually has no symptoms, it often cannot be diagnosed independently. This condition may be diagnosed by: °· Using a flexible scope to examine the colon (colonoscopy). °· Taking an X-ray of the colon after dye has been put into the colon (barium enema). °· Doing a CT scan. ° °How is this treated? °You may not need treatment for this condition if you have never developed an infection related to diverticulosis. If you have had an infection before, treatment may include: °· Eating a high-fiber diet. This may include eating more fruits, vegetables, and grains. °· Taking a fiber supplement. °· Taking a live bacteria supplement  (probiotic). °· Taking medicine to relax your colon. °· Taking antibiotic medicines. ° °Follow these instructions at home: °· Drink 6-8 glasses of water or more each day to prevent constipation. °· Try not to strain when you have a bowel movement. °· If you have had an infection before: °? Eat more fiber as directed by your health care provider or your diet and nutrition specialist (dietitian). °? Take a fiber supplement or probiotic, if your health care provider approves. °· Take over-the-counter and prescription medicines only as told by your health care provider. °· If you were prescribed an antibiotic, take it as told by your health care provider. Do not stop taking the antibiotic even if you start to feel better. °· Keep all follow-up visits as told by your health care provider. This is important. °Contact a health care provider if: °· You have pain in your abdomen. °· You have bloating. °· You have cramps. °· You have not had a bowel movement in 3 days. °Get help right away if: °· Your pain gets worse. °· Your bloating becomes very bad. °· You have a fever or chills, and your symptoms suddenly get worse. °· You vomit. °· You have bowel movements that are bloody or black. °· You have bleeding from your rectum. °Summary °· Diverticulosis is a condition that develops when small pouches (diverticula) form in the wall of the large intestine (colon). °· You may have a few pouches or many of them. °· This condition is most often diagnosed during an exam for other colon problems. °· If you have had an   infection related to diverticulosis, treatment may include increasing the fiber in your diet, taking supplements, or taking medicines. °This information is not intended to replace advice given to you by your health care provider. Make sure you discuss any questions you have with your health care provider. °Document Released: 09/13/2004 Document Revised: 11/05/2016 Document Reviewed: 11/05/2016 °Elsevier Interactive  Patient Education © 2017 [employer] °Colon Polyps °Polyps are tissue growths inside the body. Polyps can grow in many places, including the large intestine (colon). A polyp may be a round bump or a mushroom-shaped growth. You could have one polyp or several. °Most colon polyps are noncancerous (benign). However, some colon polyps can become cancerous over time. °What are the causes? °The exact cause of colon polyps is not known. °What increases the risk? °This condition is more likely to develop in people who: °· Have a family history of colon cancer or colon polyps. °· Are older than 50 or older than 45 if they are African American. °· Have inflammatory bowel disease, such as ulcerative colitis or Crohn disease. °· Are overweight. °· Smoke cigarettes. °· Do not get enough exercise. °· Drink too much alcohol. °· Eat a diet that is: °? High in fat and red meat. °? Low in fiber. °· Had childhood cancer that was treated with abdominal radiation. ° °What are the signs or symptoms? °Most polyps do not cause symptoms. If you have symptoms, they may include: °· Blood coming from your rectum when having a bowel movement. °· Blood in your stool. The stool may look dark red or black. °· A change in bowel habits, such as constipation or diarrhea. ° °How is this diagnosed? °This condition is diagnosed with a colonoscopy. This is a procedure that uses a lighted, flexible scope to look at the inside of your colon. °How is this treated? °Treatment for this condition involves removing any polyps that are found. Those polyps will then be tested for cancer. If cancer is found, your health care provider will talk to you about options for colon cancer treatment. °Follow these instructions at home: °Diet °· Eat plenty of fiber, such as fruits, vegetables, and whole grains. °· Eat foods that are high in calcium and vitamin D, such as milk, cheese, yogurt, eggs, liver, fish, and broccoli. °· Limit foods high in fat, red meats, and  processed meats, such as hot dogs, sausage, bacon, and lunch meats. °· Maintain a healthy weight, or lose weight if recommended by your health care provider. °General instructions °· Do not smoke cigarettes. °· Do not drink alcohol excessively. °· Keep all follow-up visits as told by your health care provider. This is important. This includes keeping regularly scheduled colonoscopies. Talk to your health care provider about when you need a colonoscopy. °· Exercise every day or as told by your health care provider. °Contact a health care provider if: °· You have new or worsening bleeding during a bowel movement. °· You have new or increased blood in your stool. °· You have a change in bowel habits. °· You unexpectedly lose weight. °This information is not intended to replace advice given to you by your health care provider. Make sure you discuss any questions you have with your health care provider. °Document Released: 09/12/2004 Document Revised: 05/24/2016 Document Reviewed: 11/07/2015 °Elsevier Interactive Patient Education © 2018 [employer] ° °Colonoscopy °Discharge Instructions ° °Read the instructions outlined below and refer to this sheet in the next few weeks. These discharge instructions provide you with general information on caring   for yourself after you leave the hospital. Your doctor may also give you specific instructions. While your treatment has been planned according to the most current medical practices available, unavoidable complications occasionally occur. If you have any problems or questions after discharge, call Dr. Rourk at [phone]. °ACTIVITY °· You may resume your regular activity, but move at a slower pace for the next 24 hours.  °· Take frequent rest periods for the next 24 hours.  °· Walking will help get rid of the air and reduce the bloated feeling in your belly (abdomen).  °· No driving for 24 hours (because of the medicine (anesthesia) used during the test).   °· Do not sign any  important legal documents or operate any machinery for 24 hours (because of the anesthesia used during the test).  °NUTRITION °· Drink plenty of fluids.  °· You may resume your normal diet as instructed by your doctor.  °· Begin with a light meal and progress to your normal diet. Heavy or fried foods are harder to digest and may make you feel sick to your stomach (nauseated).  °· Avoid alcoholic beverages for 24 hours or as instructed.  °MEDICATIONS °· You may resume your normal medications unless your doctor tells you otherwise.  °WHAT YOU CAN EXPECT TODAY °· Some feelings of bloating in the abdomen.  °· Passage of more gas than usual.  °· Spotting of blood in your stool or on the toilet paper.  °IF YOU HAD POLYPS REMOVED DURING THE COLONOSCOPY: °· No aspirin products for 7 days or as instructed.  °· No alcohol for 7 days or as instructed.  °· Eat a soft diet for the next 24 hours.  °FINDING OUT THE RESULTS OF YOUR TEST °Not all test results are available during your visit. If your test results are not back during the visit, make an appointment with your caregiver to find out the results. Do not assume everything is normal if you have not heard from your caregiver or the medical facility. It is important for you to follow up on all of your test results.  °SEEK IMMEDIATE MEDICAL ATTENTION IF: °· You have more than a spotting of blood in your stool.  °· Your belly is swollen (abdominal distention).  °· You are nauseated or vomiting.  °· You have a temperature over 101.  °· You have abdominal pain or discomfort that is severe or gets worse throughout the day.  ° °Colon polyp and diverticulosis information provided ° °Further recommendations to follow pending review of pathology report ° °

## 2017-07-08 NOTE — H&P (Signed)
 @LOGO @   Primary Care Physician:  Mirna MiresHill, Gerald, MD Primary Gastroenterologist:  Dr. Jena Gaussourk  Pre-Procedure History & Physical: HPI:  Tony Cannon is a 61 y.o. male is here for a screening colonoscopy. First ever screening examination. No bowel symptoms. Family history colon cancer.  Past Medical History:  Diagnosis Date  . Coronary atherosclerosis of native coronary artery   . Essential hypertension, benign   . Old myocardial infarction   . Other and unspecified hyperlipidemia     Past Surgical History:  Procedure Laterality Date  . CARDIAC CATHETERIZATION      Prior to Admission medications   Medication Sig Start Date End Date Taking? Authorizing Provider  aspirin EC 81 MG tablet Take 81 mg by mouth daily.   Yes [provider]  fexofenadine (ALLEGRA) 180 MG tablet Take 180 mg by mouth daily.   Yes [provider]  lisinopril (PRINIVIL,ZESTRIL) 40 MG tablet TAKE ONE TABLET BY MOUTH ONCE DAILY 11/02/16  Yes Branch, Dorothe PeaJonathan F, MD  metoprolol succinate (TOPROL-XL) 25 MG 24 hr tablet TAKE ONE TABLET BY MOUTH ONCE DAILY Patient taking differently: TAKE ONE-HALF TABLET (12.5 MG) BY MOUTH ONCE DAILY 03/21/16  Yes Branch, Dorothe PeaJonathan F, MD  polyethylene glycol-electrolytes (TRILYTE) 420 g solution Take 4,000 mLs by mouth as directed. 04/24/17  Yes Hobert Poplaski, Gerrit Friendsobert M, MD  pravastatin (PRAVACHOL) 40 MG tablet TAKE ONE TABLET BY MOUTH IN THE EVENING 03/13/17  Yes Branch, Dorothe PeaJonathan F, MD  nitroGLYCERIN (NITROSTAT) 0.4 MG SL tablet Place 0.4 mg under the tongue every 5 (five) minutes as needed for chest pain.    [provider]    Allergies as of 04/19/2017 - Review Complete 04/16/2017  Allergen Reaction Noted  . Atorvastatin Rash 07/09/2013    History reviewed. No pertinent family history.  Social History   Social History  . Marital status: Married    Spouse name: N/A  . Number of children: N/A  . Years of education: N/A   Occupational History  . retired     Social History Main Topics  . Smoking status: Former Smoker    Quit date: 08/20/1999  . Smokeless tobacco: Never Used  . Alcohol use 0.0 oz/week  . Drug use: No  . Sexual activity: Not on file   Other Topics Concern  . Not on file   Social History Narrative  . No narrative on file    Review of Systems: See HPI, otherwise negative ROS  Physical Exam: BP (!) 149/99   Pulse (!) 109   Temp 97.8 F (36.6 C) (Oral)   Resp 19   Ht 6\' 1"  (1.854 m)   Wt 293 lb (132.9 kg)   SpO2 96%   BMI 38.66 kg/m  General:   Alert,  Well-developed, well-nourished, pleasant and cooperative in NAD Head:  Normocephalic and atraumatic. Lungs:  Clear throughout to auscultation.   No wheezes, crackles, or rhonchi. No acute distress. Heart:  Regular rate and rhythm; no murmurs, clicks, rubs,  or gallops. Abdomen:  Soft, nontender and nondistended. No masses, hepatosplenomegaly or hernias noted. Normal bowel sounds, without guarding, and without rebound.     Impression/Plan: Tony AnoWilliam M Falcon is now here to undergo a screening colonoscopy.  First ever average risk screening examination.  Risks, benefits, limitations, imponderables and alternatives regarding colonoscopy have been reviewed with the patient. Questions have been answered. All parties agreeable.     Notice:  This dictation was prepared with Dragon dictation along with smaller phrase technology. Any transcriptional errors that result  from this process are unintentional and may not be corrected upon review.

## 2017-07-09 NOTE — Op Note (Signed)
[facility] Patient Name: Tony Cannon Procedure Date: 07/08/2017 1:00 PM MRN: [id number] Date of Birth: 08-31-1956 Attending MD: Gennette Pac , MD CSN: [id number] Age: 61 Admit Type: Outpatient Procedure:                Colonoscopy Indications:              Screening for colorectal malignant neoplasm Providers:                Gennette Pac, MD, Loma Messing B. Mathis Fare RN, RN,                            Dyann Ruddle Referring MD:              Medicines:                Midazolam 4 mg IV, Meperidine 100 mg IV,                            Promethazine 25 mg IV Complications:            No immediate complications. Estimated Blood Loss:     Estimated blood loss was minimal. Procedure:                Pre-Anesthesia Assessment:                           - Prior to the procedure, a History and Physical                            was performed, and patient medications and                            allergies were reviewed. The patient's tolerance of                            previous anesthesia was also reviewed. The risks                            and benefits of the procedure and the sedation                            options and risks were discussed with the patient.                            All questions were answered, and informed consent                            was obtained. Prior Anticoagulants: The patient has                            taken no previous anticoagulant or antiplatelet                            agents. ASA Grade Assessment: II - A patient with  mild systemic disease. After reviewing the risks                            and benefits, the patient was deemed in                            satisfactory condition to undergo the procedure.                           After obtaining informed consent, the colonoscope                            was passed under direct vision. Throughout the                            procedure, the  patient's blood pressure, pulse, and                            oxygen saturations were monitored continuously. The                            EC-[id number] ([id number]) scope was introduced through                            the anus and advanced to the the cecum, identified                            by appendiceal orifice and ileocecal valve. The                            ileocecal valve, appendiceal orifice, and rectum                            were photographed. The quality of the bowel                            preparation was adequate. Scope In: 1:42:34 PM Scope Out: 2:08:44 PM Scope Withdrawal Time: 0 hours 8 minutes 11 seconds  Total Procedure Duration: 0 hours 26 minutes 10 seconds  Findings:      The perianal and digital rectal examinations were normal.      Four semi-pedunculated polyps were found in the rectum, sigmoid colon,       descending colon and hepatic flexure. The polyps were 4 to 7 mm in size.       These polyps were removed with a cold snare. Resection and retrieval       were complete. Estimated blood loss was minimal.      The exam was otherwise without abnormality on direct and retroflexion       views.      Scattered small and large-mouthed diverticula were found in the sigmoid       colon, descending colon and transverse colon. Impression:               - Four 4 to 7 mm polyps in the rectum, in the  sigmoid colon, in the descending colon and at the                            hepatic flexure, removed with a cold snare.                            Resected and retrieved.                           - The examination was otherwise normal on direct                            and retroflexion views.                           - Diverticulosis in the sigmoid colon, in the                            descending colon and in the transverse colon. Moderate Sedation:      Moderate (conscious) sedation was administered by the endoscopy nurse        and supervised by the endoscopist. The following parameters were       monitored: oxygen saturation, heart rate, blood pressure, respiratory       rate, EKG, adequacy of pulmonary ventilation, and response to care.       Total physician intraservice time was 34 minutes. Recommendation:           - Patient has a contact number available for                            emergencies. The signs and symptoms of potential                            delayed complications were discussed with the                            patient. Return to normal activities tomorrow.                            Written discharge instructions were provided to the                            patient.                           - Resume previous diet.                           - Continue present medications.                           - Repeat colonoscopy date to be determined after                            pending pathology results are reviewed for  surveillance based on pathology results.                           - Return to GI clinic (date not yet determined). Procedure Code(s):        --- Professional ---                           [phone], Colonoscopy, flexible; with removal of                            tumor(s), polyp(s), or other lesion(s) by snare                            technique                           99152, Moderate sedation services provided by the                            same physician or other qualified health care                            professional performing the diagnostic or                            therapeutic service that the sedation supports,                            requiring the presence of an independent trained                            observer to assist in the monitoring of the                            patient's level of consciousness and physiological                            status; initial 15 minutes of intraservice time,                             patient age 66 years or older                           [phone], Moderate sedation services; each additional                            15 minutes intraservice time Diagnosis Code(s):        --- Professional ---                           Z12.11, Encounter for screening for malignant                            neoplasm of colon  K62.1, Rectal polyp                           D12.5, Benign neoplasm of sigmoid colon                           D12.4, Benign neoplasm of descending colon                           D12.3, Benign neoplasm of transverse colon (hepatic                            flexure or splenic flexure)                           K57.30, Diverticulosis of large intestine without                            perforation or abscess without bleeding CPT copyright 2016 American Medical Association. All rights reserved. The codes documented in this report are preliminary and upon coder review may  be revised to meet current compliance requirements. Gerrit Friendsobert M. Adonis Ryther, MD Gennette Pacobert Michael Ami Mally, MD 07/09/2017 12:51:09 PM This report has been signed electronically. Number of Addenda: 0

## 2017-07-11 ENCOUNTER — Encounter: Payer: Self-pay | Admitting: Internal Medicine

## 2017-07-11 ENCOUNTER — Encounter (HOSPITAL_COMMUNITY): Payer: Self-pay | Admitting: Internal Medicine

## 2017-08-23 ENCOUNTER — Other Ambulatory Visit: Payer: Self-pay | Admitting: Cardiology

## 2017-10-04 ENCOUNTER — Encounter: Payer: Self-pay | Admitting: Cardiology

## 2017-10-04 ENCOUNTER — Ambulatory Visit (INDEPENDENT_AMBULATORY_CARE_PROVIDER_SITE_OTHER): Payer: BLUE CROSS/BLUE SHIELD | Admitting: Cardiology

## 2017-10-04 VITALS — BP 138/88 | HR 76 | Ht 72.0 in | Wt 298.0 lb

## 2017-10-04 DIAGNOSIS — I2510 Atherosclerotic heart disease of native coronary artery without angina pectoris: Secondary | ICD-10-CM | POA: Diagnosis not present

## 2017-10-04 DIAGNOSIS — I10 Essential (primary) hypertension: Secondary | ICD-10-CM

## 2017-10-04 DIAGNOSIS — E782 Mixed hyperlipidemia: Secondary | ICD-10-CM

## 2017-10-04 NOTE — Progress Notes (Signed)
Clinical Summary Mr. Lemelin is a 61 y.o.male seen today for follow up of the following medical problems.   1. CAD - noted multivessel disease in 2002, due to anatomy of lesions according to notes multivessel PCI was thought best intervention.  -03/2001 stent to LAD and LCX, 05/2001 stent to RCA. Cath with normal LVEF at that time.  - last visit he described some episodes of chest pain - completed echo which showed LVEF 55-60%, grade I diastolic dysfunction - 10/2015 exercise MPI showed inferior scar with mild peri-infarct ischemia, LVEF 47%. Exercised 10 minutes with no EKG changes, Duke treadmill score of 10 consistent with low risk for major cardiac events.  - imdur caused headaches, it was stopped - since last visit no significant symptoms   - sinus congestion with cough and some related SOB. No recent chest pain - compliant with meds  2. Hyperlipidemia - had a rash with lipitor, has tolerated mild statin.    3. HTN - he is compliant with meds  Past Medical History:  Diagnosis Date  . Coronary atherosclerosis of native coronary artery   . Essential hypertension, benign   . Old myocardial infarction   . Other and unspecified hyperlipidemia      No Known Allergies   Current Outpatient Prescriptions  Medication Sig Dispense Refill  . aspirin EC 81 MG tablet Take 81 mg by mouth daily.    . fexofenadine (ALLEGRA) 180 MG tablet Take 180 mg by mouth daily.    [name] lisinopril (PRINIVIL,ZESTRIL) 40 MG tablet TAKE ONE TABLET BY MOUTH ONCE DAILY 30 tablet 11  . metoprolol succinate (TOPROL-XL) 25 MG 24 hr tablet TAKE ONE TABLET BY MOUTH ONCE DAILY 90 tablet 0  . nitroGLYCERIN (NITROSTAT) 0.4 MG SL tablet Place 0.4 mg under the tongue every 5 (five) minutes as needed for chest pain.    . polyethylene glycol-electrolytes (TRILYTE) 420 g solution Take 4,000 mLs by mouth as directed. 4000 mL 0  . pravastatin (PRAVACHOL) 40 MG tablet TAKE ONE TABLET BY MOUTH IN THE EVENING 30  tablet 6   No current facility-administered medications for this visit.      Past Surgical History:  Procedure Laterality Date  . CARDIAC CATHETERIZATION    . COLONOSCOPY N/A 07/08/2017   Procedure: COLONOSCOPY;  Surgeon: Corbin Ade, MD;  Location: [facility];  Service: Endoscopy;  Laterality: N/A;  2:15 pm  . POLYPECTOMY  07/08/2017   Procedure: POLYPECTOMY;  Surgeon: Corbin Ade, MD;  Location: [facility];  Service: Endoscopy;;  colon     No Known Allergies    No family history on file.   Social History Mr. Bonet reports that he quit smoking about 18 years ago. He has never used smokeless tobacco. Mr. Bixler reports that he drinks alcohol.   Review of Systems CONSTITUTIONAL: No weight loss, fever, chills, weakness or fatigue.  HEENT: Eyes: No visual loss, blurred vision, double vision or yellow sclerae.No hearing loss, sneezing, congestion, runny nose or sore throat.  SKIN: No rash or itching.  CARDIOVASCULAR: per hpi RESPIRATORY: No shortness of breath, cough or sputum.  GASTROINTESTINAL: No anorexia, nausea, vomiting or diarrhea. No abdominal pain or blood.  GENITOURINARY: No burning on urination, no polyuria NEUROLOGICAL: No headache, dizziness, syncope, paralysis, ataxia, numbness or tingling in the extremities. No change in bowel or bladder control.  MUSCULOSKELETAL: No muscle, back pain, joint pain or stiffness.  LYMPHATICS: No enlarged nodes. No history of splenectomy.  PSYCHIATRIC: No history of  depression or anxiety.  ENDOCRINOLOGIC: No reports of sweating, cold or heat intolerance. No polyuria or polydipsia.  [name]   Physical Examination Vitals:   10/04/17 0958  BP: 138/88  Pulse: 76  SpO2: 93%   Vitals:   10/04/17 0958  Weight: 298 lb (135.2 kg)  Height: 6' (1.829 m)    Gen: resting comfortably, no acute distress HEENT: no scleral icterus, pupils equal round and reactive, no palptable cervical adenopathy,  CV: RRR, no m/rg, no jvd Resp:  Clear to auscultation bilaterally GI: abdomen is soft, non-tender, non-distended, normal bowel sounds, no hepatosplenomegaly MSK: extremities are warm, no edema.  Skin: warm, no rash Neuro:  no focal deficits Psych: appropriate affect   Diagnostic Studies 10/2015 echo Study Conclusions  - Left ventricle: The cavity size was normal. Wall thickness was increased in a pattern of mild LVH. There was severe focal basal hypertrophy of the septum. Systolic function was normal. The estimated ejection fraction was in the range of 55% to 60%. There is hypokinesis of the basal-midinferolateral and inferior myocardium. Doppler parameters are consistent with abnormal left ventricular relaxation (grade 1 diastolic dysfunction). - Right atrium: Central venous pressure (est): 3 mm Hg. - Atrial septum: No defect or patent foramen ovale was identified. - Tricuspid valve: There was physiologic regurgitation. - Pulmonary arteries: Systolic pressure could not be accurately estimated. - Pericardium, extracardiac: There was no pericardial effusion.  Impressions:  - Mild LVH with severe septal hypertrophy, LVEF 55-60%. Mid to basal inferior/inferolateral hypokinesis. Grade 1 diastolic dysfunction.  10/2015 Exercise MPI  Blood pressure demonstrated a hypertensive response to exercise.  There was no ST segment deviation noted during stress. Frequent PVC's noted.  Defect 1: There is a medium defect of severe severity present in the basal inferoseptal, basal inferior, basal inferolateral, mid inferior, mid inferolateral, apical inferior and apical lateral location.  Findings consistent with prior myocardial infarction with peri-infarct ischemia. Specifically, there is myocardial scar with a mild degree of peri-infarct ischemia extending from the apex to the base and also involving the inferoseptal and inferolateral walls.  This is an intermediate risk study.  Nuclear stress EF:  47%.     Assessment and Plan   1. CAD - no recent symptoms - continue current meds  2. Hyperlipidemia - continue current statin, request labs from pcp  3. HTN - mildly elevated in clinic, given info on DASH diet. If perstisent will need changes I medical therapy.      Antoine Poche, M.D., F.A.C.C.

## 2017-10-04 NOTE — Patient Instructions (Signed)
Medication Instructions:  Your physician recommends that you continue on your current medications as directed. Please refer to the Current Medication list given to you today.   Labwork: I WILL REQUEST LABS FROM PCP  Testing/Procedures: NONE  Follow-Up: Your physician wants you to follow-up in: 1 YEAR .  You will receive a reminder letter in the mail two months in advance. If you don't receive a letter, please call our office to schedule the follow-up appointment.   Any Other Special Instructions Will Be Listed Below (If Applicable).     If you need a refill on your cardiac medications before your next appointment, please call your pharmacy.  DASH Eating Plan DASH stands for "Dietary Approaches to Stop Hypertension." The DASH eating plan is a healthy eating plan that has been shown to reduce high blood pressure (hypertension). It may also reduce your risk for type 2 diabetes, heart disease, and stroke. The DASH eating plan may also help with weight loss. What are tips for following this plan? General guidelines  Avoid eating more than 2,300 mg (milligrams) of salt (sodium) a day. If you have hypertension, you may need to reduce your sodium intake to 1,500 mg a day.  Limit alcohol intake to no more than 1 drink a day for nonpregnant women and 2 drinks a day for men. One drink equals 12 oz of beer, 5 oz of wine, or 1 oz of hard liquor.  Work with your health care provider to maintain a healthy body weight or to lose weight. Ask what an ideal weight is for you.  Get at least 30 minutes of exercise that causes your heart to beat faster (aerobic exercise) most days of the week. Activities may include walking, swimming, or biking.  Work with your health care provider or diet and nutrition specialist (dietitian) to adjust your eating plan to your individual calorie needs. Reading food labels  Check food labels for the amount of sodium per serving. Choose foods with less than 5 percent  of the Daily Value of sodium. Generally, foods with less than 300 mg of sodium per serving fit into this eating plan.  To find whole grains, look for the word "whole" as the first word in the ingredient list. Shopping  Buy products labeled as "low-sodium" or "no salt added."  Buy fresh foods. Avoid canned foods and premade or frozen meals. Cooking  Avoid adding salt when cooking. Use salt-free seasonings or herbs instead of table salt or sea salt. Check with your health care provider or pharmacist before using salt substitutes.  Do not fry foods. Cook foods using healthy methods such as baking, boiling, grilling, and broiling instead.  Cook with heart-healthy oils, such as olive, canola, soybean, or sunflower oil. Meal planning   Eat a balanced diet that includes: ? 5 or more servings of fruits and vegetables each day. At each meal, try to fill half of your plate with fruits and vegetables. ? Up to 6-8 servings of whole grains each day. ? Less than 6 oz of lean meat, poultry, or fish each day. A 3-oz serving of meat is about the same size as a deck of cards. One egg equals 1 oz. ? 2 servings of low-fat dairy each day. ? A serving of nuts, seeds, or beans 5 times each week. ? Heart-healthy fats. Healthy fats called Omega-3 fatty acids are found in foods such as flaxseeds and coldwater fish, like sardines, salmon, and mackerel.  Limit how much you eat of the following: ?  Canned or prepackaged foods. ? Food that is high in trans fat, such as fried foods. ? Food that is high in saturated fat, such as fatty meat. ? Sweets, desserts, sugary drinks, and other foods with added sugar. ? Full-fat dairy products.  Do not salt foods before eating.  Try to eat at least 2 vegetarian meals each week.  Eat more home-cooked food and less restaurant, buffet, and fast food.  When eating at a restaurant, ask that your food be prepared with less salt or no salt, if possible. What foods are  recommended? The items listed may not be a complete list. Talk with your dietitian about what dietary choices are best for you. Grains Whole-grain or whole-wheat bread. Whole-grain or whole-wheat pasta. Brown rice. [place] Morrow. Bulgur. Whole-grain and low-sodium cereals. Pita bread. Low-fat, low-sodium crackers. Whole-wheat flour tortillas. Vegetables Fresh or frozen vegetables (raw, steamed, roasted, or grilled). Low-sodium or reduced-sodium tomato and vegetable juice. Low-sodium or reduced-sodium tomato sauce and tomato paste. Low-sodium or reduced-sodium canned vegetables. Fruits All fresh, dried, or frozen fruit. Canned fruit in natural juice (without added sugar). Meat and other protein foods Skinless chicken or [place]. Ground chicken or [place]. Pork with fat trimmed off. Fish and seafood. Egg whites. Dried beans, peas, or lentils. Unsalted nuts, nut butters, and seeds. Unsalted canned beans. Lean cuts of beef with fat trimmed off. Low-sodium, lean deli meat. Dairy Low-fat (1%) or fat-free (skim) milk. Fat-free, low-fat, or reduced-fat cheeses. Nonfat, low-sodium ricotta or cottage cheese. Low-fat or nonfat yogurt. Low-fat, low-sodium cheese. Fats and oils Soft margarine without trans fats. Vegetable oil. Low-fat, reduced-fat, or light mayonnaise and salad dressings (reduced-sodium). Canola, safflower, olive, soybean, and sunflower oils. Avocado. Seasoning and other foods Herbs. Spices. Seasoning mixes without salt. Unsalted popcorn and pretzels. Fat-free sweets. What foods are not recommended? The items listed may not be a complete list. Talk with your dietitian about what dietary choices are best for you. Grains Baked goods made with fat, such as croissants, muffins, or some breads. Dry pasta or rice meal packs. Vegetables Creamed or fried vegetables. Vegetables in a cheese sauce. Regular canned vegetables (not low-sodium or reduced-sodium). Regular canned tomato sauce and paste (not  low-sodium or reduced-sodium). Regular tomato and vegetable juice (not low-sodium or reduced-sodium). Angie Fava. Olives. Fruits Canned fruit in a light or heavy syrup. Fried fruit. Fruit in cream or butter sauce. Meat and other protein foods Fatty cuts of meat. Ribs. Fried meat. Berniece Salines. Sausage. Bologna and other processed lunch meats. Salami. Fatback. Hotdogs. Bratwurst. Salted nuts and seeds. Canned beans with added salt. Canned or smoked fish. Whole eggs or egg yolks. Chicken or [place] with skin. Dairy Whole or 2% milk, cream, and half-and-half. Whole or full-fat cream cheese. Whole-fat or sweetened yogurt. Full-fat cheese. Nondairy creamers. Whipped toppings. Processed cheese and cheese spreads. Fats and oils Butter. Stick margarine. Lard. Shortening. Ghee. Bacon fat. Tropical oils, such as coconut, palm kernel, or palm oil. Seasoning and other foods Salted popcorn and pretzels. Onion salt, garlic salt, seasoned salt, table salt, and sea salt. Worcestershire sauce. Tartar sauce. Barbecue sauce. Teriyaki sauce. Soy sauce, including reduced-sodium. Steak sauce. Canned and packaged gravies. Fish sauce. Oyster sauce. Cocktail sauce. Horseradish that you find on the shelf. Ketchup. Mustard. Meat flavorings and tenderizers. Bouillon cubes. Hot sauce and Tabasco sauce. Premade or packaged marinades. Premade or packaged taco seasonings. Relishes. Regular salad dressings. Where to find more information:  National Heart, Lung, and [place]: https://wilson-eaton.com/  American Heart Association: www.heart.org Summary  The DASH eating plan is a healthy eating plan that has been shown to reduce high blood pressure (hypertension). It may also reduce your risk for type 2 diabetes, heart disease, and stroke.  With the DASH eating plan, you should limit salt (sodium) intake to 2,300 mg a day. If you have hypertension, you may need to reduce your sodium intake to 1,500 mg a day.  When on the DASH eating plan,  aim to eat more fresh fruits and vegetables, whole grains, lean proteins, low-fat dairy, and heart-healthy fats.  Work with your health care provider or diet and nutrition specialist (dietitian) to adjust your eating plan to your individual calorie needs. This information is not intended to replace advice given to you by your health care provider. Make sure you discuss any questions you have with your health care provider. Document Released: 12/06/2011 Document Revised: 12/10/2016 Document Reviewed: 12/10/2016 Elsevier Interactive Patient Education  2017 [employer].

## 2017-10-21 ENCOUNTER — Ambulatory Visit (INDEPENDENT_AMBULATORY_CARE_PROVIDER_SITE_OTHER): Payer: BLUE CROSS/BLUE SHIELD | Admitting: Orthopedic Surgery

## 2017-10-21 ENCOUNTER — Ambulatory Visit (INDEPENDENT_AMBULATORY_CARE_PROVIDER_SITE_OTHER): Payer: BLUE CROSS/BLUE SHIELD

## 2017-10-21 VITALS — BP 160/98 | HR 73 | Ht 72.0 in | Wt 293.0 lb

## 2017-10-21 DIAGNOSIS — M25561 Pain in right knee: Secondary | ICD-10-CM | POA: Diagnosis not present

## 2017-10-21 DIAGNOSIS — M1711 Unilateral primary osteoarthritis, right knee: Secondary | ICD-10-CM

## 2017-10-21 DIAGNOSIS — M23321 Other meniscus derangements, posterior horn of medial meniscus, right knee: Secondary | ICD-10-CM | POA: Diagnosis not present

## 2017-10-21 DIAGNOSIS — G8929 Other chronic pain: Secondary | ICD-10-CM

## 2017-10-21 MED ORDER — MELOXICAM 7.5 MG PO TABS: 7.5000 mg | ORAL_TABLET | Freq: Every day | ORAL | 0 refills | Status: DC

## 2017-10-21 NOTE — Progress Notes (Signed)
NEW PATIENT OFFICE VISIT    Chief Complaint  Patient presents with  . Knee Pain    Right knee pain, no injury.    61 year old male presents to us for evaluation of his right knee. He started having knee pain in late summer no trauma. The pain started medially and has now progressed to include diffuse knee pain with swelling of his knee and now his entire leg including his ankle and calf. He has no swelling on the opposite side. He is not a smoker. Is not diabetic he does have hypertension he takes lisinopril and metoprolol.  He has not tried any medication.  He says the knee feels weak and feels like it may give way. The pain is dull mild constant with waxing and waning in intensity    Review of Systems  Constitutional: Negative for fever.  Respiratory: Negative for shortness of breath.   Cardiovascular: Negative for chest pain.  Skin: Negative.   Neurological: Negative for tingling and sensory change.     Past Medical History:  Diagnosis Date  . Coronary atherosclerosis of native coronary artery   . Essential hypertension, benign   . Old myocardial infarction   . Other and unspecified hyperlipidemia     Past Surgical History:  Procedure Laterality Date  . CARDIAC CATHETERIZATION    . COLONOSCOPY N/A 07/08/2017   Procedure: COLONOSCOPY;  Surgeon: Corbin Adeourk, Robert M, MD;  Location: [facility];  Service: Endoscopy;  Laterality: N/A;  2:15 pm  . POLYPECTOMY  07/08/2017   Procedure: POLYPECTOMY;  Surgeon: Corbin Adeourk, Robert M, MD;  Location: [facility];  Service: Endoscopy;;  colon    No family history on file. Social History  Substance Use Topics  . Smoking status: Former Smoker    Quit date: 08/20/1999  . Smokeless tobacco: Never Used  . Alcohol use 0.0 oz/week    No Known Allergies  No outpatient prescriptions have been marked as taking for the 10/21/17 encounter (Office Visit) with Vickki HearingHarrison, Stanley E, MD.    BP (!) 160/98   Pulse 73   Ht 6' (1.829 m)   Wt 293  lb (132.9 kg)   BMI 39.74 kg/m   Physical Exam  Constitutional: He is oriented to person, place, and time. He appears well-developed and well-nourished.  Vital signs have been reviewed and are stable. Gen. appearance the patient is well-developed and well-nourished with normal grooming and hygiene.   Musculoskeletal:       Right knee: Medial joint line tenderness noted.  GAIT IS normal  Neurological: He is alert and oriented to person, place, and time.  Skin: Skin is warm and dry. No erythema.  Psychiatric: He has a normal mood and affect.  Vitals reviewed.   Left Knee Exam   Tenderness  None  Range of Motion  Normal left knee ROM  Muscle Strength  Normal left knee strength  Comments:  Neurovascular intact ligament stable  Right Knee Exam   Tenderness  The patient is experiencing tenderness in the medial joint line.  Range of Motion  Normal right knee ROM  Muscle Strength  Normal right knee strength  Tests  McMurrays:  Medial - Positive       Drawer:       Anterior - Negative      Comments:  Sensation is normal skin is intact and although he has swelling is not vascular related     Meds ordered this encounter  Medications  . meloxicam (MOBIC) 7.5 MG tablet  Sig: Take 1 tablet (7.5 mg total) by mouth daily.    Dispense:  30 tablet    Refill:  0   X-ray show mild symmetric joint space narrowing with normal alignment please see the report  Encounter Diagnoses  Name Primary?  . Chronic pain of right knee Yes  . Derangement of posterior horn of medial meniscus of right knee   . Primary osteoarthritis of right knee      PLAN:   *Meloxicam and home exercises rest and activity modification. Come back in a month if no improvement he would qualify for MRI evaluation for meniscal tear

## 2017-10-21 NOTE — Patient Instructions (Addendum)
Meniscus Tear A meniscus tear is a knee injury in which a piece of the meniscus is torn. The meniscus is a thick, rubbery, wedge-shaped cartilage in the knee. Two menisci are located in each knee. They sit between the upper bone (femur) and lower bone (tibia) that make up the knee joint. Each meniscus acts as a shock absorber for the knee. A torn meniscus is one of the most common types of knee injuries. This injury can range from mild to severe. Surgery may be needed for a severe tear. What are the causes? This injury may be caused by any squatting, twisting, or pivoting movement. Sports-related injuries are the most common cause. These often occur from:  Running and stopping suddenly.  Changing direction.  Being tackled or knocked off your feet.  As people get older, their meniscus gets thinner and weaker. In these people, tears can happen more easily, such as from climbing stairs. What increases the risk? This injury is more likely to happen to:  People who play contact sports.  Males.  People who are 30?59 years of age.  What are the signs or symptoms? Symptoms of this injury include:  Knee pain, especially at the side of the knee joint. You may feel pain when the injury occurs, or you may only hear a pop and feel pain later.  A feeling that your knee is clicking, catching, locking, or giving way.  Not being able to fully bend or extend your knee.  Bruising or swelling in your knee.  How is this diagnosed? This injury may be diagnosed based on your symptoms and a physical exam. The physical exam may include:  Moving your knee in different ways.  Feeling for tenderness.  Listening for a clicking sound.  Checking if your knee locks or catches.  You may also have tests, such as:  X-rays.  MRI.  A procedure to look inside your knee with a narrow surgical telescope (arthroscopy).  You may be referred to a knee specialist (orthopedic surgeon). How is this  treated? Treatment for this injury depends on the severity of the tear. Treatment for a mild tear may include:  Rest.  Medicine to reduce pain and swelling. This is usually a nonsteroidal anti-inflammatory drug (NSAID).  A knee brace or an elastic sleeve or wrap.  Using crutches or a walker to keep weight off your knee and to help you walk.  Exercises to strengthen your knee (physical therapy).  You may need surgery if you have a severe tear or if other treatments are not working. Follow these instructions at home: Managing pain and swelling  Take over-the-counter and prescription medicines only as told by your health care provider.  If directed, apply ice to the injured area: ? Put ice in a plastic bag. ? Place a towel between your skin and the bag. ? Leave the ice on for 20 minutes, 2-3 times per day.  Raise (elevate) the injured area above the level of your heart while you are sitting or lying down. Activity  Do not use the injured limb to support your body weight until your health care provider says that you can. Use crutches or a walker as told by your health care provider.  Return to your normal activities as told by your health care provider. Ask your health care provider what activities are safe for you.  Perform range-of-motion exercises only as told by your health care provider.  Begin doing exercises to strengthen your knee and leg muscles only   as told by your health care provider. After you recover, your health care provider may recommend these exercises to help prevent another injury. General instructions  Use a knee brace or elastic wrap as told by your health care provider.  Keep all follow-up visits as told by your health care provider. This is important. Contact a health care provider if:  You have a fever.  Your knee becomes red, tender, or swollen.  Your pain medicine is not helping.  Your symptoms get worse or do not improve after 2 weeks of home  care. This information is not intended to replace advice given to you by your health care provider. Make sure you discuss any questions you have with your health care provider. Document Released: 03/09/2003 Document Revised: 05/24/2016 Document Reviewed: 04/11/2015 Elsevier Interactive Patient Education  2018 Elsevier Inc.  

## 2017-11-18 ENCOUNTER — Ambulatory Visit: Payer: BLUE CROSS/BLUE SHIELD | Admitting: Orthopedic Surgery

## 2017-11-18 ENCOUNTER — Encounter: Payer: Self-pay | Admitting: Orthopedic Surgery

## 2017-11-18 VITALS — BP 157/88 | HR 84 | Ht 72.0 in | Wt 300.0 lb

## 2017-11-18 DIAGNOSIS — G8929 Other chronic pain: Secondary | ICD-10-CM | POA: Diagnosis not present

## 2017-11-18 DIAGNOSIS — M25561 Pain in right knee: Secondary | ICD-10-CM

## 2017-11-18 NOTE — Patient Instructions (Addendum)
Continue meloxicam  We will schedule you for MRI of your right knee to determine if you have a torn meniscus  Amy will get insurance approval then [facility]

## 2017-11-18 NOTE — Progress Notes (Signed)
Follow-up visit  Chief Complaint  Patient presents with  . Knee Pain    right    Initial history  ; 61 year old male presents to us for evaluation of his right knee. He started having knee pain in late summer no trauma. The pain started medially and has now progressed to include diffuse knee pain with swelling of his knee and now his entire leg including his ankle and calf. He has no swelling on the opposite side. He is not a smoker. Is not diabetic he does have hypertension he takes lisinopril and metoprolol.   He has not tried any medication.   He says the knee feels weak and feels like it may give way. The pain is dull mild constant with waxing and waning in intensity       Review of Systems no change from his October 22 review of systems Constitutional: Negative for fever.  Respiratory: Negative for shortness of breath.   Cardiovascular: Negative for chest pain.  Skin: Negative.   Neurological: Negative for tingling and sensory change.    Current history: He reports no change in the symptoms in his right knee after his course of meloxicam  He continues to have giving way episodes and medial knee pain   Past Medical History:  Diagnosis Date  . Coronary atherosclerosis of native coronary artery   . Essential hypertension, benign   . Old myocardial infarction   . Other and unspecified hyperlipidemia     BP (!) 157/88   Pulse 84   Ht 6' (1.829 m)   Wt 300 lb (136.1 kg)   BMI 40.69 kg/m   Physical Exam  Constitutional: He is oriented to person, place, and time. He appears well-developed and well-nourished.  Neurological: He is alert and oriented to person, place, and time.  Psychiatric: He has a normal mood and affect. His behavior is normal. Judgment and thought content normal.   Gait pattern is normal  Examination of the right knee shows tenderness over the medial joint line.  McMurray sign is positive for torn medial meniscus ligaments are stable.  Neurovascular  exam is intact skin is normal   Diagnosis torn medial meniscus right knee  Prior x-ray report: Radiology report dictated by Dr. Romeo AppleHarrison  Knee pain  X-ray report for right knee   Chief complaint knee pain  3 views of the knee are obtained. AP lateral and patellar sunrise  Findings: Anatomic is alignment is noted. The bone shows no fracture dislocation or bone tumor. Soft tissue swelling none. Mild joint space narrowing is seen  Impression: mild OA right knee  MRI will be obtained right knee to evaluate for torn medial meniscus

## 2017-11-22 ENCOUNTER — Other Ambulatory Visit: Payer: Self-pay | Admitting: Cardiology

## 2017-11-30 ENCOUNTER — Ambulatory Visit
Admission: RE | Admit: 2017-11-30 | Discharge: 2017-11-30 | Disposition: A | Discharge status: Home / Self Care | Payer: BLUE CROSS/BLUE SHIELD | Source: Ambulatory Visit | Attending: Orthopedic Surgery | Admitting: Orthopedic Surgery

## 2017-11-30 DIAGNOSIS — M25561 Pain in right knee: Principal | ICD-10-CM

## 2017-11-30 DIAGNOSIS — G8929 Other chronic pain: Secondary | ICD-10-CM

## 2017-12-11 ENCOUNTER — Telehealth: Payer: Self-pay | Admitting: Radiology

## 2017-12-11 ENCOUNTER — Encounter: Payer: Self-pay | Admitting: Orthopedic Surgery

## 2017-12-11 ENCOUNTER — Ambulatory Visit: Payer: BLUE CROSS/BLUE SHIELD | Admitting: Orthopedic Surgery

## 2017-12-11 VITALS — BP 181/80 | HR 70 | Ht 72.0 in | Wt 302.0 lb

## 2017-12-11 DIAGNOSIS — M1711 Unilateral primary osteoarthritis, right knee: Secondary | ICD-10-CM

## 2017-12-11 DIAGNOSIS — M23321 Other meniscus derangements, posterior horn of medial meniscus, right knee: Secondary | ICD-10-CM | POA: Diagnosis not present

## 2017-12-11 DIAGNOSIS — M23351 Other meniscus derangements, posterior horn of lateral meniscus, right knee: Secondary | ICD-10-CM | POA: Diagnosis not present

## 2017-12-11 NOTE — Telephone Encounter (Signed)
I called [employer] first spoke to [place]. And was advised no authorization required for outpatient surgery.

## 2017-12-11 NOTE — Patient Instructions (Signed)
Meniscus Injury, Arthroscopy Arthroscopy is a surgical procedure that involves the use of a small scope that has a camera and surgical instruments on the end (arthroscope). An arthroscope can be used to repair your meniscus injury.  LET YOUR HEALTH CARE PROVIDER KNOW ABOUT:  Any allergies you have.  All medicines you are taking, including vitamins, herbs, eyedrops, creams, and over-the-counter medicines.  Any recent colds or infections you have had or currently have.  Previous problems you or members of your family have had with the use of anesthetics.  Any blood disorders or blood clotting problems you have.  Previous surgeries you have had.  Medical conditions you have. RISKS AND COMPLICATIONS Generally, this is a safe procedure. However, as with any procedure, problems can occur. Possible problems include:  Damage to nerves or blood vessels.  Excess bleeding.  Blood clots.  Infection. BEFORE THE PROCEDURE  Do not eat or drink for 6-8 hours before the procedure.  Take medicines as directed by your surgeon. Ask your surgeon about changing or stopping your regular medicines.  You may have lab tests the morning of surgery. PROCEDURE  You will be given one of the following:   A medicine that numbs the area (local anesthesia).  A medicine that makes you go to sleep (general anesthesia).  A medicine injected into your spine that numbs your body below the waist (spinal anesthesia). Most often, several small cuts (incisions) are made in the knee. The arthroscope and instruments go into the incisions to repair the damage. The torn portion of the meniscus is removed.   AFTER THE PROCEDURE  You will be taken to the recovery area where your progress will be monitored. When you are awake, stable, and taking fluids without complications, you will be allowed to go home. This is usually the same day. A torn or stretched ligament (ligament sprain) may take 6-8 weeks to heal.   It  takes about the 4-6 WEEKS if your surgeon removed a torn meniscus.  A repaired meniscus may require 6-12 weeks of recovery time.  A torn ligament needing reconstructive surgery may take 6-12 months to heal fully.   This information is not intended to replace advice given to you by your health care provider. Make sure you discuss any questions you have with your health care provider. You have decided to proceed with operative arthroscopy of the knee. You have decided not to continue with nonoperative measures such as but not limited to oral medication, weight loss, activity modification, physical therapy, bracing, or injection.  We will perform operative arthroscopy of the knee. Some of the risks associated with arthroscopic surgery of the knee include but are not limited to Bleeding Infection Swelling Stiffness Blood clot Pain  If you're not comfortable with these risks and would like to continue with nonoperative treatment please let Dr. Harrison know prior to your surgery.   Document Released: 12/14/2000 Document Revised: 12/22/2013 Document Reviewed: 05/15/2013 Elsevier Interactive Patient Education 2016 Elsevier Inc. You have decided to proceed with operative arthroscopy of the knee. You have decided not to continue with nonoperative measures such as but not limited to oral medication, weight loss, activity modification, physical therapy, bracing, or injection.  We will perform operative arthroscopy of the knee. Some of the risks associated with arthroscopic surgery of the knee include but are not limited to Bleeding Infection Swelling Stiffness Blood clot Pain  If you're not comfortable with these risks and would like to continue with nonoperative treatment please let Dr. Harrison   know prior to your surgery. 

## 2017-12-11 NOTE — Progress Notes (Signed)
PROGRESS NOTE   Chief Complaint  Patient presents with  . Knee Pain    right   . Results    MRI review    TODAY:  Continues to complain of pain and popping right knee  Initial history   61 year old male presents to us for evaluation of his right knee. He started having knee pain in late summer no trauma. The pain started medially and has now progressed to include diffuse knee pain with swelling of his knee and now his entire leg including his ankle and calf. He has no swelling on the opposite side. He is not a smoker. Is not diabetic he does have hypertension he takes lisinopril and metoprolol.   He has not tried any medication.   He says the knee feels weak and feels like it may give way. The pain is dull mild constant with waxing and waning in intensity       Review of Systems no change from his October 22 review of systems Constitutional: Negative for fever.  Respiratory: Negative for shortness of breath.   Cardiovascular: Negative for chest pain.  Skin: Negative.   Neurological: Negative for tingling and sensory change.      Current history: He reports no change in the symptoms in his right knee after his course of meloxicam   He continues to have giving way episodes and medial knee pain  Past Medical History:  Diagnosis Date  . Coronary atherosclerosis of native coronary artery   . Essential hypertension, benign   . Old myocardial infarction   . Other and unspecified hyperlipidemia    Past Surgical History:  Procedure Laterality Date  . CARDIAC CATHETERIZATION    . COLONOSCOPY N/A 07/08/2017   Procedure: COLONOSCOPY;  Surgeon: Corbin Adeourk, Robert M, MD;  Location: [facility];  Service: Endoscopy;  Laterality: N/A;  2:15 pm  . POLYPECTOMY  07/08/2017   Procedure: POLYPECTOMY;  Surgeon: Corbin Adeourk, Robert M, MD;  Location: [facility];  Service: Endoscopy;;  colon    History reviewed. No pertinent family history.  No family history of diabetes hypertension chronic medical  disease  Social History   Tobacco Use  . Smoking status: Former Smoker    Last attempt to quit: 08/20/1999    Years since quitting: 18.3  . Smokeless tobacco: Never Used  Substance Use Topics  . Alcohol use: Yes    Alcohol/week: 8.4 oz    Types: 14 Cans of beer per week  . Drug use: No    BP (!) 181/80   Pulse 70   Ht 6' (1.829 m)   Wt (!) 302 lb (137 kg)   BMI 40.96 kg/m  Healthy-appearing well-developed well-nourished male oriented x3 mood affect normal gait and station without limping  Neurological: He is alert and oriented to person, place, and time.  Skin: Skin is warm and dry. No erythema.  Psychiatric: He has a normal mood and affect.  Vitals reviewed.     Left Knee Exam    Tenderness  None   Range of Motion  Normal left knee ROM   Muscle Strength  Normal left knee strength   Comments:  Neurovascular intact ligament stable   Right Knee Exam    Tenderness  The patient is experiencing tenderness in the medial joint line.   Range of Motion  Normal right knee ROM   Muscle Strength  Normal right knee strength   Tests  McMurrays:  Medial - Positive       Drawer:  Anterior - Negative       Comments:  Sensation is normal skin is intact and although he has swelling is not vascular related   MRI report  IMPRESSION: 1. Horizontal tears of the medial and lateral posterior horns extending into the posterior bodies. 2. Minimal degenerative changes of the medial patellofemoral compartment.     Electronically Signed   By: Obie DredgeWilliam T Derry M.D.   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  The procedure has been fully reviewed with the patient; The risks and benefits of  surgery have been discussed and explained and understood. Alternative treatment has also been reviewed, questions were encouraged and answered. The postoperative plan is also been reviewed.   Arthroscopy right knee medial lateral meniscectomy

## 2017-12-11 NOTE — Addendum Note (Signed)
Addended by: Vickki HearingHARRISON, Nitara Szczerba E on: 12/11/2017 09:56 AM   Modules accepted: Orders, SmartSet

## 2017-12-12 NOTE — Patient Instructions (Signed)
[facility]  12/12/2017     @PREFPERIOPPHARMACY @   Your procedure is scheduled on  12/19/2017  Report to [facility] at  615   A.M.  Call this number if you have problems the morning of surgery:  [phone]   Remember:  Do not eat food or drink liquids after midnight.  Take these medicines the morning of surgery with A SIP OF WATER  Lisinopril, toprol XL.   Do not wear jewelry, make-up or nail polish.  Do not wear lotions, powders, or perfumes, or deodorant.  Do not shave 48 hours prior to surgery.  Men may shave face and neck.  Do not bring valuables to the hospital.  [facility] is not responsible for any belongings or valuables.  Contacts, dentures or bridgework may not be worn into surgery.  Leave your suitcase in the car.  After surgery it may be brought to your room.  For patients admitted to the hospital, discharge time will be determined by your treatment team.  Patients discharged the day of surgery will not be allowed to drive home.   Name and phone number of your driver:   Family Special instructions:  None  Please read over the following fact sheets that you were given. Anesthesia Post-op Instructions and Care and Recovery After Surgery      Knee Ligament Injury, Arthroscopy Arthroscopy is a surgical technique in which your health care provider examines your knee through a small, pencil-sized telescope (arthroscope). Often, repairs to injured ligaments can be done with instruments in the arthroscope. Arthroscopy is less invasive than open-knee surgery. Tell a health care provider about:  Any allergies you have.  All medicines you are taking, including vitamins, herbs, eye drops, creams, and over-the-counter medicines.  Any problems you or family members have had with anesthetic medicines.  Any blood disorders you have.  Any surgeries you have had.  Any medical conditions you have. What are the risks? Generally, this is a safe  procedure. However, as with any procedure, problems can occur. Possible problems include:  Infection.  Bleeding.  Stiffness.  What happens before the procedure?  Ask your health care provider about changing or stopping any regular medicines. Avoid taking aspirin or blood thinners as directed by your health care provider.  Do not eat or drink anything after midnight the night before surgery.  If you smoke, do not smoke for at least 2 weeks before your surgery.  Do not drink alcohol starting the day before your surgery.  Let your health care provider know if you develop a cold or any infection before your surgery.  Arrange for someone to drive you home after the surgery or after your hospital stay. Also arrange for someone to help you with activities during recovery. What happens during the procedure?  Small monitors will be put on your body. They are used to check your heart, blood pressure, and oxygen levels.  An IV access tube will be put into one of your veins. Medicine will be able to flow directly into your body through this IV tube.  You might be given a medicine to help you relax (sedative).  You will be given a medicine that makes you go to sleep (general anesthetic), and a breathing tube will be placed into your lungs during the procedure.  Several small incisions are made in your knee. Saline fluid is placed into one of the incisions to expand the knee and clear  away any blood in the knee.  Your health care provider will insert the arthroscope to examine the injured knee.  During arthroscopy, your health care provider may find a partial or complete tear in a ligament.  Tools can be inserted through the other incisions to repair the injured ligaments.  The incisions are then closed with absorbable stitches and covered with dressings. What happens after the procedure?  You will be taken to the recovery area where you will be monitored.  When you are awake, stable,  and taking fluids without problems, you will be allowed to go home. This information is not intended to replace advice given to you by your health care provider. Make sure you discuss any questions you have with your health care provider. Document Released: 12/14/2000 Document Revised: 05/24/2016 Document Reviewed: 07/29/2013 Elsevier Interactive Patient Education  2017 [employer] Knee Arthroscopy, Care After Refer to this sheet in the next few weeks. These instructions provide you with information about caring for yourself after your procedure. Your health care provider may also give you more specific instructions. Your treatment has been planned according to current medical practices, but problems sometimes occur. Call your health care provider if you have any problems or questions after your procedure. What can I expect after the procedure? After the procedure, it is common to have:  Soreness.  Pain.  Follow these instructions at home: Bathing  Do not take baths, swim, or use a hot tub until your health care provider approves. Incision care  There are many different ways to close and cover an incision, including stitches, skin glue, and adhesive strips. Follow your health care provider's instructions about: ? Incision care. ? Bandage (dressing) changes and removal. ? Incision closure removal.  Check your incision area every day for signs of infection. Watch for: ? Redness, swelling, or pain. ? Fluid, blood, or pus. Activity  Avoid strenuous activities for as long as directed by your health care provider.  Return to your normal activities as directed by your health care provider. Ask your health care provider what activities are safe for you.  Perform range-of-motion exercises only as directed by your health care provider.  Do not lift anything that is heavier than 10 lb (4.5 kg).  Do not drive or operate heavy machinery while taking pain medicine.  If you were given  crutches, use them as directed by your health care provider. Managing pain, stiffness, and swelling  If directed, apply ice to the injured area: ? Put ice in a plastic bag. ? Place a towel between your skin and the bag. ? Leave the ice on for 20 minutes, 2-3 times per day.  Raise the injured area above the level of your heart while you are sitting or lying down as directed by your health care provider. General instructions  Keep all follow-up visits as directed by your health care provider. This is important.  Take medicines only as directed by your health care provider.  Do not use any tobacco products, including cigarettes, chewing tobacco, or electronic cigarettes. If you need help quitting, ask your health care provider.  If you were given compression stockings, wear them as directed by your health care provider. These stockings help prevent blood clots and reduce swelling in your legs. Contact a health care provider if:  You have severe pain with any movement of your knee.  You notice a bad smell coming from the incision or dressing.  You have redness, swelling, or pain at the site  of your incision.  You have fluid, blood, or pus coming from your incision. Get help right away if:  You develop a rash.  You have a fever.  You have difficulty breathing or have shortness of breath.  You develop pain in your calves or in the back of your knee.  You develop chest pain.  You develop numbness or tingling in your leg or foot. This information is not intended to replace advice given to you by your health care provider. Make sure you discuss any questions you have with your health care provider. Document Released: 07/06/2005 Document Revised: 05/18/2016 Document Reviewed: 12/13/2014 Elsevier Interactive Patient Education  2017 [employer]  Arthroscopic Knee Ligament Repair, Care After This sheet gives you information about how to care for yourself after your procedure. Your  health care provider may also give you more specific instructions. If you have problems or questions, contact your health care provider. What can I expect after the procedure? After the procedure, it is common to have:  Pain in your knee.  Bruising and swelling on your knee, calf, and ankle for 3-4 days.  Fatigue.  Follow these instructions at home: If you have a brace or immobilizer:  Wear the brace or immobilizer as told by your health care provider. Remove it only as told by your health care provider.  Loosen the splint or immobilizer if your toes tingle, become numb, or turn cold and blue.  Keep the brace or immobilizer clean. Bathing  Do not take baths, swim, or use a hot tub until your health care provider approves. Ask your health care provider if you can take showers.  Keep your bandage (dressing) dry until your health care provider says that it can be removed. Cover it and your brace or immobilizer with a watertight covering when you take a shower. Incision care  Follow instructions from your health care provider about how to take care of your incision. Make sure you: ? Wash your hands with soap and water before you change your bandage (dressing). If soap and water are not available, use hand sanitizer. ? Change your dressing as told by your health care provider. ? Leave stitches (sutures), skin glue, or adhesive strips in place. These skin closures may need to stay in place for 2 weeks or longer. If adhesive strip edges start to loosen and curl up, you may trim the loose edges. Do not remove adhesive strips completely unless your health care provider tells you to do that.  Check your incision area every day for signs of infection. Check for: ? More redness, swelling, or pain. ? More fluid or blood. ? Warmth. ? Pus or a bad smell. Managing pain, stiffness, and swelling  If directed, put ice on the affected area. ? If you have a removable brace or immobilizer, remove it  as told by your health care provider. ? Put ice in a plastic bag. ? Place a towel between your skin and the bag or between your brace or immobilizer and the bag. ? Leave the ice on for 20 minutes, 2-3 times a day.  Move your toes often to avoid stiffness and to lessen swelling.  Raise (elevate) the injured area above the level of your heart while you are sitting or lying down. Driving  Do not drive until your health care provider approves. If you have a brace or immobilizer on your leg, ask your health care provider when it is safe for you to drive.  Do not drive  or use heavy machinery while taking prescription pain medicine. Activity  Rest as directed. Ask your health care provider what activities are safe for you.  Do physical therapy exercises as told by your health care provider. Physical therapy will help you regain strength and motion in your knee.  Follow instructions from your health care provider about: ? When you may start motion exercises. ? When you may start riding a stationary bike and doing other low-impact activities. ? When you may start to jog and do other high-impact activities. Safety  Do not use the injured limb to support your body weight until your health care provider says that you can. Use crutches as told by your health care provider. General instructions  Do not use any products that contain nicotine or tobacco, such as cigarettes and e-cigarettes. These can delay bone healing. If you need help quitting, ask your health care provider.  To prevent or treat constipation while you are taking prescription pain medicine, your health care provider may recommend that you: ? Drink enough fluid to keep your urine clear or pale yellow. ? Take over-the-counter or prescription medicines. ? Eat foods that are high in fiber, such as fresh fruits and vegetables, whole grains, and beans. ? Limit foods that are high in fat and processed sugars, such as fried and sweet  foods.  Take over-the-counter and prescription medicines only as told by your health care provider.  Keep all follow-up visits as told by your health care provider. This is important. Contact a health care provider if:  You have more redness, swelling, or pain around an incision.  You have more fluid or blood coming from an incision.  Your incision feels warm to the touch.  You have a fever.  You have pain or swelling in your knee, and it gets worse.  You have pain that does not get better with medicine. Get help right away if:  You have trouble breathing.  You have pus or a bad smell coming from an incision.  You have numbness and tingling near the knee joint. Summary  After the procedure, it is common to have knee pain with bruising and swelling on your knee, calf, and ankle.  Icing your knee and raising your leg above the level of your heart will help control the pain and the swelling.  Do physical therapy exercises as told by your health care provider. Physical therapy will help you regain strength and motion in your knee. This information is not intended to replace advice given to you by your health care provider. Make sure you discuss any questions you have with your health care provider. Document Released: 10/07/2013 Document Revised: 12/11/2016 Document Reviewed: 12/11/2016 Elsevier Interactive Patient Education  2017 [employer]  General Anesthesia, Adult General anesthesia is the use of medicines to make a person "go to sleep" (be unconscious) for a medical procedure. General anesthesia is often recommended when a procedure:  Is long.  Requires you to be still or in an unusual position.  Is major and can cause you to lose blood.  Is impossible to do without general anesthesia.  The medicines used for general anesthesia are called general anesthetics. In addition to making you sleep, the medicines:  Prevent pain.  Control your blood pressure.  Relax  your muscles.  Tell a health care provider about:  Any allergies you have.  All medicines you are taking, including vitamins, herbs, eye drops, creams, and over-the-counter medicines.  Any problems you or family members  have had with anesthetic medicines.  Types of anesthetics you have had in the past.  Any bleeding disorders you have.  Any surgeries you have had.  Any medical conditions you have.  Any history of heart or lung conditions, such as heart failure, sleep apnea, or chronic obstructive pulmonary disease (COPD).  Whether you are pregnant or may be pregnant.  Whether you use tobacco, alcohol, marijuana, or street drugs.  Any history of Financial plannermilitary service.  Any history of depression or anxiety. What are the risks? Generally, this is a safe procedure. However, problems may occur, including:  Allergic reaction to anesthetics.  Lung and heart problems.  Inhaling food or liquids from your stomach into your lungs (aspiration).  Injury to nerves.  Waking up during your procedure and being unable to move (rare).  Extreme agitation or a state of mental confusion (delirium) when you wake up from the anesthetic.  Air in the bloodstream, which can lead to stroke.  These problems are more likely to develop if you are having a major surgery or if you have an advanced medical condition. You can prevent some of these complications by answering all of your health care provider's questions thoroughly and by following all pre-procedure instructions. General anesthesia can cause side effects, including:  Nausea or vomiting  A sore throat from the breathing tube.  Feeling cold or shivery.  Feeling tired, washed out, or achy.  Sleepiness or drowsiness.  Confusion or agitation.  What happens before the procedure? Staying hydrated Follow instructions from your health care provider about hydration, which may include:  Up to 2 hours before the procedure - you may continue  to drink clear liquids, such as water, clear fruit juice, black coffee, and plain tea.  Eating and drinking restrictions Follow instructions from your health care provider about eating and drinking, which may include:  8 hours before the procedure - stop eating heavy meals or foods such as meat, fried foods, or fatty foods.  6 hours before the procedure - stop eating light meals or foods, such as toast or cereal.  6 hours before the procedure - stop drinking milk or drinks that contain milk.  2 hours before the procedure - stop drinking clear liquids.  Medicines  Ask your health care provider about: ? Changing or stopping your regular medicines. This is especially important if you are taking diabetes medicines or blood thinners. ? Taking medicines such as aspirin and ibuprofen. These medicines can thin your blood. Do not take these medicines before your procedure if your health care provider instructs you not to. ? Taking new dietary supplements or medicines. Do not take these during the week before your procedure unless your health care provider approves them.  If you are told to take a medicine or to continue taking a medicine on the day of the procedure, take the medicine with sips of water. General instructions   Ask if you will be going home the same day, the following day, or after a longer hospital stay. ? Plan to have someone take you home. ? Plan to have someone stay with you for the first 24 hours after you leave the hospital or clinic.  For 3-6 weeks before the procedure, try not to use any tobacco products, such as cigarettes, chewing tobacco, and e-cigarettes.  You may brush your teeth on the morning of the procedure, but make sure to spit out the toothpaste. What happens during the procedure?  You will be given anesthetics through a mask  and through an IV tube in one of your veins.  You may receive medicine to help you relax (sedative).  As soon as you are asleep, a  breathing tube may be used to help you breathe.  An anesthesia specialist will stay with you throughout the procedure. He or she will help keep you comfortable and safe by continuing to give you medicines and adjusting the amount of medicine that you get. He or she will also watch your blood pressure, pulse, and oxygen levels to make sure that the anesthetics do not cause any problems.  If a breathing tube was used to help you breathe, it will be removed before you wake up. The procedure may vary among health care providers and hospitals. What happens after the procedure?  You will wake up, often slowly, after the procedure is complete, usually in a recovery area.  Your blood pressure, heart rate, breathing rate, and blood oxygen level will be monitored until the medicines you were given have worn off.  You may be given medicine to help you calm down if you feel anxious or agitated.  If you will be going home the same day, your health care provider may check to make sure you can stand, drink, and urinate.  Your health care providers will treat your pain and side effects before you go home.  Do not drive for 24 hours if you received a sedative.  You may: ? Feel nauseous and vomit. ? Have a sore throat. ? Have mental slowness. ? Feel cold or shivery. ? Feel sleepy. ? Feel tired. ? Feel sore or achy, even in parts of your body where you did not have surgery. This information is not intended to replace advice given to you by your health care provider. Make sure you discuss any questions you have with your health care provider. Document Released: 03/25/2008 Document Revised: 05/29/2016 Document Reviewed: 12/01/2015 Elsevier Interactive Patient Education  2018 [employer] General Anesthesia, Adult, Care After These instructions provide you with information about caring for yourself after your procedure. Your health care provider may also give you more specific instructions. Your  treatment has been planned according to current medical practices, but problems sometimes occur. Call your health care provider if you have any problems or questions after your procedure. What can I expect after the procedure? After the procedure, it is common to have:  Vomiting.  A sore throat.  Mental slowness.  It is common to feel:  Nauseous.  Cold or shivery.  Sleepy.  Tired.  Sore or achy, even in parts of your body where you did not have surgery.  Follow these instructions at home: For at least 24 hours after the procedure:  Do not: ? Participate in activities where you could fall or become injured. ? Drive. ? Use heavy machinery. ? Drink alcohol. ? Take sleeping pills or medicines that cause drowsiness. ? Make important decisions or sign legal documents. ? Take care of children on your own.  Rest. Eating and drinking  If you vomit, drink water, juice, or soup when you can drink without vomiting.  Drink enough fluid to keep your urine clear or pale yellow.  Make sure you have little or no nausea before eating solid foods.  Follow the diet recommended by your health care provider. General instructions  Have a responsible adult stay with you until you are awake and alert.  Return to your normal activities as told by your health care provider. Ask your health care provider  what activities are safe for you.  Take over-the-counter and prescription medicines only as told by your health care provider.  If you smoke, do not smoke without supervision.  Keep all follow-up visits as told by your health care provider. This is important. Contact a health care provider if:  You continue to have nausea or vomiting at home, and medicines are not helpful.  You cannot drink fluids or start eating again.  You cannot urinate after 8-12 hours.  You develop a skin rash.  You have fever.  You have increasing redness at the site of your procedure. Get help right  away if:  You have difficulty breathing.  You have chest pain.  You have unexpected bleeding.  You feel that you are having a life-threatening or urgent problem. This information is not intended to replace advice given to you by your health care provider. Make sure you discuss any questions you have with your health care provider. Document Released: 03/25/2001 Document Revised: 05/21/2016 Document Reviewed: 12/01/2015 Elsevier Interactive Patient Education  [employer].

## 2017-12-16 ENCOUNTER — Encounter (HOSPITAL_COMMUNITY): Payer: Self-pay

## 2017-12-16 ENCOUNTER — Other Ambulatory Visit: Payer: Self-pay

## 2017-12-16 ENCOUNTER — Encounter (HOSPITAL_COMMUNITY)
Admission: RE | Admit: 2017-12-16 | Discharge: 2017-12-16 | Disposition: A | Discharge status: Home / Self Care | Payer: BLUE CROSS/BLUE SHIELD | Source: Ambulatory Visit | Attending: Orthopedic Surgery | Admitting: Orthopedic Surgery

## 2017-12-16 DIAGNOSIS — I2510 Atherosclerotic heart disease of native coronary artery without angina pectoris: Secondary | ICD-10-CM | POA: Diagnosis not present

## 2017-12-16 DIAGNOSIS — Z6841 Body Mass Index (BMI) 40.0 and over, adult: Secondary | ICD-10-CM | POA: Diagnosis not present

## 2017-12-16 DIAGNOSIS — Z01818 Encounter for other preprocedural examination: Secondary | ICD-10-CM

## 2017-12-16 DIAGNOSIS — I252 Old myocardial infarction: Secondary | ICD-10-CM | POA: Diagnosis not present

## 2017-12-16 DIAGNOSIS — M23221 Derangement of posterior horn of medial meniscus due to old tear or injury, right knee: Secondary | ICD-10-CM | POA: Diagnosis not present

## 2017-12-16 DIAGNOSIS — K219 Gastro-esophageal reflux disease without esophagitis: Secondary | ICD-10-CM | POA: Diagnosis not present

## 2017-12-16 DIAGNOSIS — M23251 Derangement of posterior horn of lateral meniscus due to old tear or injury, right knee: Secondary | ICD-10-CM | POA: Diagnosis present

## 2017-12-16 DIAGNOSIS — M94261 Chondromalacia, right knee: Secondary | ICD-10-CM | POA: Diagnosis not present

## 2017-12-16 DIAGNOSIS — I10 Essential (primary) hypertension: Secondary | ICD-10-CM | POA: Diagnosis not present

## 2017-12-16 DIAGNOSIS — E785 Hyperlipidemia, unspecified: Secondary | ICD-10-CM | POA: Diagnosis not present

## 2017-12-16 DIAGNOSIS — Z87891 Personal history of nicotine dependence: Secondary | ICD-10-CM | POA: Diagnosis not present

## 2017-12-16 DIAGNOSIS — Z86010 Personal history of colonic polyps: Secondary | ICD-10-CM | POA: Diagnosis not present

## 2017-12-16 HISTORY — DX: Personal history of other diseases of the musculoskeletal system and connective tissue: Z87.39

## 2017-12-16 LAB — CBC WITH DIFFERENTIAL/PLATELET
BASOS ABS: 0 10*3/uL (ref 0.0–0.1)
BASOS PCT: 0 %
EOS ABS: 0.3 10*3/uL (ref 0.0–0.7)
Eosinophils Relative: 4 %
HEMATOCRIT: 41 % (ref 39.0–52.0)
Hemoglobin: 12.9 g/dL — ABNORMAL LOW (ref 13.0–17.0)
Lymphocytes Relative: 37 %
Lymphs Abs: 2.5 10*3/uL (ref 0.7–4.0)
MCH: 28 pg (ref 26.0–34.0)
MCHC: 31.5 g/dL (ref 30.0–36.0)
MCV: 89.1 fL (ref 78.0–100.0)
MONO ABS: 0.7 10*3/uL (ref 0.1–1.0)
MONOS PCT: 10 %
NEUTROS ABS: 3.3 10*3/uL (ref 1.7–7.7)
Neutrophils Relative %: 49 %
PLATELETS: 289 10*3/uL (ref 150–400)
RBC: 4.6 MIL/uL (ref 4.22–5.81)
RDW: 14 % (ref 11.5–15.5)
WBC: 6.8 10*3/uL (ref 4.0–10.5)

## 2017-12-16 LAB — BASIC METABOLIC PANEL
ANION GAP: 8 (ref 5–15)
BUN: 15 mg/dL (ref 6–20)
CALCIUM: 9.3 mg/dL (ref 8.9–10.3)
CO2: 25 mmol/L (ref 22–32)
CREATININE: 1.09 mg/dL (ref 0.61–1.24)
Chloride: 104 mmol/L (ref 101–111)
Glucose, Bld: 99 mg/dL (ref 65–99)
Potassium: 3.8 mmol/L (ref 3.5–5.1)
SODIUM: 137 mmol/L (ref 135–145)

## 2017-12-16 LAB — SURGICAL PCR SCREEN
MRSA, PCR: NEGATIVE
STAPHYLOCOCCUS AUREUS: NEGATIVE

## 2017-12-17 NOTE — H&P (Signed)
PREOP HISTORY / PHYSICAL FOR AMBULATORY SURGERY    Chief Complaint  Patient presents with  . Knee Pain      right   . Results      MRI review     TODAY:  Continues to complain of pain and popping right knee   Initial history    61 year old male presents to us for evaluation of his right knee. He started having knee pain in late summer no trauma. The pain started medially and has now progressed to include diffuse knee pain with swelling of his knee and now his entire leg including his ankle and calf. He has no swelling on the opposite side. He is not a smoker. Is not diabetic he does have hypertension he takes lisinopril and metoprolol.   He has not tried any medication.   He says the knee feels weak and feels like it may give way. The pain is dull mild constant with waxing and waning in intensity       Review of Systems no change from his October 22 review of systems Constitutional: Negative for fever.  Respiratory: Negative for shortness of breath.   Cardiovascular: Negative for chest pain.  Skin: Negative.   Neurological: Negative for tingling and sensory change.      Current history: He reports no change in the symptoms in his right knee after his course of meloxicam   He continues to have giving way episodes and medial knee pain       Past Medical History:  Diagnosis Date  . Coronary atherosclerosis of native coronary artery    . Essential hypertension, benign    . Old myocardial infarction    . Other and unspecified hyperlipidemia      Past Surgical History:  Procedure Laterality Date  . CARDIAC CATHETERIZATION      . COLONOSCOPY N/A 07/08/2017    Procedure: COLONOSCOPY;  Surgeon: Corbin Adeourk, Robert M, MD;  Location: [facility];  Service: Endoscopy;  Laterality: N/A;  2:15 pm  . POLYPECTOMY   07/08/2017    Procedure: POLYPECTOMY;  Surgeon: Corbin Adeourk, Robert M, MD;  Location: [facility];  Service: Endoscopy;;  colon      History reviewed. No pertinent family  history.   No family history of diabetes hypertension chronic medical disease   Social History         Tobacco Use  . Smoking status: Former Smoker      Last attempt to quit: 08/20/1999      Years since quitting: 18.3  . Smokeless tobacco: Never Used  Substance Use Topics  . Alcohol use: Yes      Alcohol/week: 8.4 oz      Types: 14 Cans of beer per week  . Drug use: No      BP (!) 181/80   Pulse 70   Ht 6' (1.829 m)   Wt (!) 302 lb (137 kg)   BMI 40.96 kg/m  Healthy-appearing well-developed well-nourished male oriented x3 mood affect normal gait and station without limping   Neurological: He is alert and oriented to person, place, and time.  Skin: Skin is warm and dry. No erythema.  Psychiatric: He has a normal mood and affect.  Vitals reviewed.     Left Knee Exam    Tenderness  None   Range of Motion  Normal left knee ROM   Muscle Strength  Normal left knee strength   Comments:  Neurovascular intact ligament stable   Right Knee Exam  Tenderness  The patient is experiencing tenderness in the medial joint line.   Range of Motion  Normal right knee ROM   Muscle Strength  Normal right knee strength   Tests  McMurrays:  Medial - Positive       Drawer:       Anterior - Negative       Comments:  Sensation is normal skin is intact and although he has swelling is not vascular related     MRI report   IMPRESSION: 1. Horizontal tears of the medial and lateral posterior horns extending into the posterior bodies. 2. Minimal degenerative changes of the medial patellofemoral compartment.     Electronically Signed   By: Obie DredgeWilliam T Derry M.D.   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   The procedure has been fully reviewed with the patient; The risks and benefits of surgery have been discussed and explained and understood. Alternative treatment has also been reviewed, questions were encouraged and answered. The postoperative plan is also been reviewed.     Arthroscopy right knee medial lateral meniscectomy

## 2017-12-19 ENCOUNTER — Ambulatory Visit (HOSPITAL_COMMUNITY): Payer: BLUE CROSS/BLUE SHIELD | Admitting: Anesthesiology

## 2017-12-19 ENCOUNTER — Ambulatory Visit (HOSPITAL_COMMUNITY)
Admission: RE | Admit: 2017-12-19 | Discharge: 2017-12-19 | Disposition: A | Discharge status: Home / Self Care | Payer: BLUE CROSS/BLUE SHIELD | Source: Ambulatory Visit | Attending: Orthopedic Surgery | Admitting: Orthopedic Surgery

## 2017-12-19 ENCOUNTER — Encounter (HOSPITAL_COMMUNITY): Payer: Self-pay | Admitting: *Deleted

## 2017-12-19 ENCOUNTER — Encounter (HOSPITAL_COMMUNITY)
Admission: RE | Disposition: A | Discharge status: Home / Self Care | Payer: Self-pay | Source: Ambulatory Visit | Attending: Orthopedic Surgery

## 2017-12-19 DIAGNOSIS — M23300 Other meniscus derangements, unspecified lateral meniscus, right knee: Secondary | ICD-10-CM

## 2017-12-19 DIAGNOSIS — Z87891 Personal history of nicotine dependence: Secondary | ICD-10-CM | POA: Insufficient documentation

## 2017-12-19 DIAGNOSIS — M94261 Chondromalacia, right knee: Secondary | ICD-10-CM | POA: Insufficient documentation

## 2017-12-19 DIAGNOSIS — Z9889 Other specified postprocedural states: Secondary | ICD-10-CM

## 2017-12-19 DIAGNOSIS — Z6841 Body Mass Index (BMI) 40.0 and over, adult: Secondary | ICD-10-CM | POA: Insufficient documentation

## 2017-12-19 DIAGNOSIS — M23321 Other meniscus derangements, posterior horn of medial meniscus, right knee: Secondary | ICD-10-CM | POA: Diagnosis not present

## 2017-12-19 DIAGNOSIS — I2510 Atherosclerotic heart disease of native coronary artery without angina pectoris: Secondary | ICD-10-CM | POA: Insufficient documentation

## 2017-12-19 DIAGNOSIS — M23221 Derangement of posterior horn of medial meniscus due to old tear or injury, right knee: Secondary | ICD-10-CM | POA: Insufficient documentation

## 2017-12-19 DIAGNOSIS — Z86010 Personal history of colonic polyps: Secondary | ICD-10-CM | POA: Insufficient documentation

## 2017-12-19 DIAGNOSIS — E785 Hyperlipidemia, unspecified: Secondary | ICD-10-CM | POA: Insufficient documentation

## 2017-12-19 DIAGNOSIS — I10 Essential (primary) hypertension: Secondary | ICD-10-CM | POA: Insufficient documentation

## 2017-12-19 DIAGNOSIS — I252 Old myocardial infarction: Secondary | ICD-10-CM | POA: Insufficient documentation

## 2017-12-19 DIAGNOSIS — M23251 Derangement of posterior horn of lateral meniscus due to old tear or injury, right knee: Secondary | ICD-10-CM | POA: Insufficient documentation

## 2017-12-19 DIAGNOSIS — K219 Gastro-esophageal reflux disease without esophagitis: Secondary | ICD-10-CM | POA: Insufficient documentation

## 2017-12-19 HISTORY — PX: KNEE ARTHROSCOPY WITH MEDIAL MENISECTOMY: SHX5651

## 2017-12-19 HISTORY — PX: KNEE ARTHROSCOPY WITH LATERAL MENISECTOMY: SHX6193

## 2017-12-19 SURGERY — ARTHROSCOPY, KNEE, WITH MEDIAL MENISCECTOMY
Anesthesia: General | Site: Knee | Laterality: Right

## 2017-12-19 MED ORDER — SODIUM CHLORIDE 0.9 % IR SOLN
Status: DC | PRN
  Administered 2017-12-19 (×3): 3000 mL

## 2017-12-19 MED ORDER — FENTANYL CITRATE (PF) 250 MCG/5ML IJ SOLN
INTRAMUSCULAR | Status: AC
  Filled 2017-12-19: qty 5

## 2017-12-19 MED ORDER — DEXAMETHASONE SODIUM PHOSPHATE 4 MG/ML IJ SOLN
INTRAMUSCULAR | Status: AC
  Filled 2017-12-19: qty 1

## 2017-12-19 MED ORDER — SUCCINYLCHOLINE CHLORIDE 20 MG/ML IJ SOLN
INTRAMUSCULAR | Status: DC | PRN
  Administered 2017-12-19: 140 mg via INTRAVENOUS

## 2017-12-19 MED ORDER — ROCURONIUM BROMIDE 50 MG/5ML IV SOLN
INTRAVENOUS | Status: AC
  Filled 2017-12-19: qty 1

## 2017-12-19 MED ORDER — EPINEPHRINE PF 1 MG/ML IJ SOLN
INTRAMUSCULAR | Status: AC
  Filled 2017-12-19: qty 5

## 2017-12-19 MED ORDER — BUPIVACAINE-EPINEPHRINE (PF) 0.5% -1:200000 IJ SOLN
INTRAMUSCULAR | Status: AC
  Filled 2017-12-19: qty 60

## 2017-12-19 MED ORDER — POVIDONE-IODINE 10 % EX SWAB: 2.0000 "application " | Freq: Once | CUTANEOUS | Status: DC

## 2017-12-19 MED ORDER — HYDROCODONE-ACETAMINOPHEN 5-325 MG PO TABS: 1.0000 | ORAL_TABLET | ORAL | 0 refills | Status: DC | PRN

## 2017-12-19 MED ORDER — PROPOFOL 10 MG/ML IV BOLUS
INTRAVENOUS | Status: AC
  Filled 2017-12-19: qty 40

## 2017-12-19 MED ORDER — BUPIVACAINE-EPINEPHRINE (PF) 0.5% -1:200000 IJ SOLN
INTRAMUSCULAR | Status: DC | PRN
  Administered 2017-12-19: 60 mL

## 2017-12-19 MED ORDER — FENTANYL CITRATE (PF) 100 MCG/2ML IJ SOLN
INTRAMUSCULAR | Status: DC | PRN
  Administered 2017-12-19 (×2): 100 ug via INTRAVENOUS

## 2017-12-19 MED ORDER — NEOSTIGMINE METHYLSULFATE 10 MG/10ML IV SOLN
INTRAVENOUS | Status: DC | PRN
  Administered 2017-12-19: 3 mg via INTRAVENOUS

## 2017-12-19 MED ORDER — PROPOFOL 10 MG/ML IV BOLUS
INTRAVENOUS | Status: DC | PRN
  Administered 2017-12-19: 50 mg via INTRAVENOUS
  Administered 2017-12-19: 150 mg via INTRAVENOUS
  Administered 2017-12-19: 50 mg via INTRAVENOUS

## 2017-12-19 MED ORDER — LIDOCAINE HCL (PF) 1 % IJ SOLN
INTRAMUSCULAR | Status: AC
  Filled 2017-12-19: qty 5

## 2017-12-19 MED ORDER — GLYCOPYRROLATE 0.2 MG/ML IJ SOLN
INTRAMUSCULAR | Status: DC | PRN
  Administered 2017-12-19: 0.4 mg via INTRAVENOUS

## 2017-12-19 MED ORDER — ONDANSETRON HCL 4 MG/2ML IJ SOLN
INTRAMUSCULAR | Status: AC
  Filled 2017-12-19: qty 2

## 2017-12-19 MED ORDER — LABETALOL HCL 5 MG/ML IV SOLN
INTRAVENOUS | Status: AC
  Filled 2017-12-19: qty 4

## 2017-12-19 MED ORDER — ONDANSETRON HCL 4 MG/2ML IJ SOLN
4.0000 mg | Freq: Once | INTRAMUSCULAR | Status: AC
  Administered 2017-12-19: 4 mg via INTRAVENOUS

## 2017-12-19 MED ORDER — DEXTROSE 5 % IV SOLN
3.0000 g | INTRAVENOUS | Status: AC
  Administered 2017-12-19: 3 g via INTRAVENOUS
  Filled 2017-12-19: qty 3000

## 2017-12-19 MED ORDER — MIDAZOLAM HCL 2 MG/2ML IJ SOLN
1.0000 mg | INTRAMUSCULAR | Status: AC
  Administered 2017-12-19: 2 mg via INTRAVENOUS

## 2017-12-19 MED ORDER — LIDOCAINE HCL (CARDIAC) 10 MG/ML IV SOLN
INTRAVENOUS | Status: DC | PRN
  Administered 2017-12-19: 50 mg via INTRAVENOUS

## 2017-12-19 MED ORDER — CEFAZOLIN SODIUM-DEXTROSE 2-4 GM/100ML-% IV SOLN
INTRAVENOUS | Status: AC
  Filled 2017-12-19: qty 100

## 2017-12-19 MED ORDER — DEXAMETHASONE SODIUM PHOSPHATE 4 MG/ML IJ SOLN
4.0000 mg | Freq: Once | INTRAMUSCULAR | Status: AC
  Administered 2017-12-19: 4 mg via INTRAVENOUS

## 2017-12-19 MED ORDER — MIDAZOLAM HCL 2 MG/2ML IJ SOLN
INTRAMUSCULAR | Status: AC
  Filled 2017-12-19: qty 2

## 2017-12-19 MED ORDER — FENTANYL CITRATE (PF) 100 MCG/2ML IJ SOLN
INTRAMUSCULAR | Status: AC
  Filled 2017-12-19: qty 2

## 2017-12-19 MED ORDER — METOPROLOL TARTRATE 5 MG/5ML IV SOLN
INTRAVENOUS | Status: AC
  Filled 2017-12-19: qty 5

## 2017-12-19 MED ORDER — IBUPROFEN 800 MG PO TABS: 800.0000 mg | ORAL_TABLET | Freq: Three times a day (TID) | ORAL | 1 refills | Status: DC | PRN

## 2017-12-19 MED ORDER — CEFAZOLIN SODIUM-DEXTROSE 1-4 GM/50ML-% IV SOLN
INTRAVENOUS | Status: AC
  Filled 2017-12-19: qty 50

## 2017-12-19 MED ORDER — FENTANYL CITRATE (PF) 100 MCG/2ML IJ SOLN: 25.0000 ug | INTRAMUSCULAR | Status: DC | PRN

## 2017-12-19 MED ORDER — SUCCINYLCHOLINE CHLORIDE 20 MG/ML IJ SOLN
INTRAMUSCULAR | Status: AC
  Filled 2017-12-19: qty 1

## 2017-12-19 MED ORDER — ROCURONIUM BROMIDE 100 MG/10ML IV SOLN
INTRAVENOUS | Status: DC | PRN
  Administered 2017-12-19: 25 mg via INTRAVENOUS
  Administered 2017-12-19: 5 mg via INTRAVENOUS

## 2017-12-19 MED ORDER — LACTATED RINGERS IV SOLN
INTRAVENOUS | Status: DC
  Administered 2017-12-19 (×2): via INTRAVENOUS

## 2017-12-19 MED ORDER — GLYCOPYRROLATE 0.2 MG/ML IJ SOLN
INTRAMUSCULAR | Status: AC
  Filled 2017-12-19: qty 1

## 2017-12-19 MED ORDER — LABETALOL HCL 5 MG/ML IV SOLN
INTRAVENOUS | Status: DC | PRN
  Administered 2017-12-19: 5 mg via INTRAVENOUS

## 2017-12-19 MED ORDER — CHLORHEXIDINE GLUCONATE 4 % EX LIQD: 60.0000 mL | Freq: Once | CUTANEOUS | Status: DC

## 2017-12-19 SURGICAL SUPPLY — 50 items
BAG HAMPER (MISCELLANEOUS) ×3 IMPLANT
BANDAGE ELASTIC 6 LF NS (GAUZE/BANDAGES/DRESSINGS) ×3 IMPLANT
BLADE 11 SAFETY STRL DISP (BLADE) ×3 IMPLANT
BLADE AGGRESSIVE PLUS 4.0 (BLADE) ×3 IMPLANT
BLADE SURG SZ11 CARB STEEL (BLADE) ×3 IMPLANT
BNDG CMPR MED 5X6 ELC HKLP NS (GAUZE/BANDAGES/DRESSINGS) ×1
CHLORAPREP W/TINT 26ML (MISCELLANEOUS) ×4 IMPLANT
CLOTH BEACON ORANGE TIMEOUT ST (SAFETY) ×3 IMPLANT
COOLER CRYO IC GRAV AND TUBE (ORTHOPEDIC SUPPLIES) ×3 IMPLANT
CUFF CRYO KNEE LG 20X31 COOLER (ORTHOPEDIC SUPPLIES) ×2 IMPLANT
CUFF TOURNIQUET SINGLE 34IN LL (TOURNIQUET CUFF) ×2 IMPLANT
DECANTER SPIKE VIAL GLASS SM (MISCELLANEOUS) ×6 IMPLANT
GAUZE SPONGE 4X4 12PLY STRL (GAUZE/BANDAGES/DRESSINGS) ×3 IMPLANT
GAUZE SPONGE 4X4 16PLY XRAY LF (GAUZE/BANDAGES/DRESSINGS) ×3 IMPLANT
GAUZE XEROFORM 5X9 LF (GAUZE/BANDAGES/DRESSINGS) ×3 IMPLANT
GLOVE BIO SURGEON STRL SZ7 (GLOVE) ×2 IMPLANT
GLOVE BIOGEL PI IND STRL 7.0 (GLOVE) ×1 IMPLANT
GLOVE BIOGEL PI INDICATOR 7.0 (GLOVE) ×4
GLOVE SKINSENSE NS SZ8.0 LF (GLOVE) ×2
GLOVE SKINSENSE STRL SZ8.0 LF (GLOVE) ×1 IMPLANT
GLOVE SS N UNI LF 8.5 STRL (GLOVE) ×3 IMPLANT
GOWN STRL REUS W/ TWL LRG LVL3 (GOWN DISPOSABLE) ×1 IMPLANT
GOWN STRL REUS W/TWL LRG LVL3 (GOWN DISPOSABLE) ×6 IMPLANT
GOWN STRL REUS W/TWL XL LVL3 (GOWN DISPOSABLE) ×3 IMPLANT
HLDR LEG FOAM (MISCELLANEOUS) ×1 IMPLANT
IV NS IRRIG 3000ML ARTHROMATIC (IV SOLUTION) ×8 IMPLANT
KIT BLADEGUARD II DBL (SET/KITS/TRAYS/PACK) ×3 IMPLANT
KIT ROOM TURNOVER AP CYSTO (KITS) ×3 IMPLANT
LEG HOLDER FOAM (MISCELLANEOUS) ×2
MANIFOLD NEPTUNE II (INSTRUMENTS) ×3 IMPLANT
MARKER SKIN DUAL TIP RULER LAB (MISCELLANEOUS) ×3 IMPLANT
NDL HYPO 18GX1.5 BLUNT FILL (NEEDLE) ×1 IMPLANT
NDL HYPO 21X1.5 SAFETY (NEEDLE) ×1 IMPLANT
NDL SPNL 18GX3.5 QUINCKE PK (NEEDLE) ×1 IMPLANT
NEEDLE HYPO 18GX1.5 BLUNT FILL (NEEDLE) ×3 IMPLANT
NEEDLE HYPO 21X1.5 SAFETY (NEEDLE) ×3 IMPLANT
NEEDLE SPNL 18GX3.5 QUINCKE PK (NEEDLE) ×3 IMPLANT
NS IRRIG 1000ML POUR BTL (IV SOLUTION) ×3 IMPLANT
PACK ARTHRO LIMB DRAPE STRL (MISCELLANEOUS) ×3 IMPLANT
PAD ABD 5X9 TENDERSORB (GAUZE/BANDAGES/DRESSINGS) ×3 IMPLANT
PAD ARMBOARD 7.5X6 YLW CONV (MISCELLANEOUS) ×3 IMPLANT
PADDING CAST COTTON 6X4 STRL (CAST SUPPLIES) ×3 IMPLANT
SET ARTHROSCOPY INST (INSTRUMENTS) ×3 IMPLANT
SET ARTHROSCOPY PUMP TUBE (IRRIGATION / IRRIGATOR) ×3 IMPLANT
SET BASIN LINEN APH (SET/KITS/TRAYS/PACK) ×3 IMPLANT
SUT ETHILON 3 0 FSL (SUTURE) ×3 IMPLANT
SYR 30ML LL (SYRINGE) ×3 IMPLANT
SYRINGE 10CC LL (SYRINGE) ×3 IMPLANT
TUBE CONNECTING 12'X1/4 (SUCTIONS) ×4
TUBE CONNECTING 12X1/4 (SUCTIONS) ×7 IMPLANT

## 2017-12-19 NOTE — Op Note (Signed)
12/19/2017  8:29 AM  PATIENT:  Tony Cannon  61 y.o. male  PRE-OPERATIVE DIAGNOSIS:  medial and lateral meniscal tear right knee  POST-OPERATIVE DIAGNOSIS:  medial and lateral meniscal tear right knee  PROCEDURE:  Procedure(s): KNEE ARTHROSCOPY WITH MEDIAL MENISECTOMY (Right) KNEE ARTHROSCOPY WITH LATERAL MENISECTOMY (Right)  29880  Knee arthroscopy dictation  The patient was identified in the preoperative holding area using 2 approved identification mechanisms. The chart was reviewed and updated. The surgical site was confirmed as right knee and marked with an indelible marker.  The patient was taken to the operating room for anesthesia. After successful  general anesthesia, ancef was used as IV antibiotics.  The patient was placed in the supine position with the (right) the operative extremity in an arthroscopic leg holder and the opposite extremity in a padded leg holder.  The timeout was executed.  A lateral portal was established with an 11 blade and the scope was introduced into the joint. A diagnostic arthroscopy was performed in circumferential manner examining the entire knee joint. A medial portal was established and the diagnostic arthroscopy was repeated using a probe to palpate intra-articular structures as they were encountered.   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 The meniscal fragments were removed with a motorized shaver. The meniscus was balanced with a combination of a motorized shaver and a 50  wand until a stable rim was obtained.  The lateral meniscus was also resected with a duckbill forceps meniscal fragments were  removed with a motorized shaver and the meniscus was balanced with the motorized shaver and an 50 degree wand  The arthroscopic pump was placed on the wash mode and any excess debris was removed from the joint using suction.  60 cc of Marcaine with epinephrine was injected through the arthroscope.  The portals were closed with 3-0 nylon suture.  A sterile bandage, Ace wrap and Cryo/Cuff was placed and the Cryo/Cuff was activated. The patient was taken to the recovery room in stable condition.   SURGEON:  Surgeon(s) and Role:    * Ahamed Hofland E, MD - Primary  PHYSICIAN ASSISTANT:   ASSISTANTS: none   ANESTHESIA:   general  EBL:  0 mL   BLOOD ADMINISTERED:none  DRAINS: none   LOCAL MEDICATIONS USED:  MARCAINE     SPECIMEN:  No Specimen  DISPOSITION OF SPECIMEN:  N/A  COUNTS:  YES  TOURNIQUET:  * Missing tourniquet times found for documented tourniquets in log: [id number] *  DICTATION: .Dragon Dictation  PLAN OF CARE: Discharge to home after PACU  PATIENT DISPOSITION:  PACU - hemodynamically stable.   Delay start of Pharmacological VTE agent (>24hrs) due to surgical blood loss or risk of bleeding: not applicable  

## 2017-12-19 NOTE — Anesthesia Procedure Notes (Signed)
Procedure Name: Intubation Date/Time: 12/19/2017 7:36 AM Performed by: Vista Deck, CRNA Pre-anesthesia Checklist: Patient identified, Patient being monitored, Timeout performed, Emergency Drugs available and Suction available Patient Re-evaluated:Patient Re-evaluated prior to induction Oxygen Delivery Method: Circle System Utilized Preoxygenation: Pre-oxygenation with 100% oxygen Induction Type: IV induction, Rapid sequence and Cricoid Pressure applied Laryngoscope Size: Mac and 3 Grade View: Grade I Tube type: Oral Tube size: 7.0 mm Number of attempts: 1 Airway Equipment and Method: stylet and Oral airway Placement Confirmation: ETT inserted through vocal cords under direct vision,  positive ETCO2 and breath sounds checked- equal and bilateral Secured at: 23 cm Tube secured with: Tape Dental Injury: Teeth and Oropharynx as per pre-operative assessment

## 2017-12-19 NOTE — Transfer of Care (Signed)
Immediate Anesthesia Transfer of Care Note  Patient: Tony Cannon  Procedure(s) Performed: KNEE ARTHROSCOPY WITH MEDIAL MENISECTOMY (Right Knee) KNEE ARTHROSCOPY WITH LATERAL MENISECTOMY (Right Knee)  Patient Location: PACU  Anesthesia Type:General  Level of Consciousness: awake, alert  and patient cooperative  Airway & Oxygen Therapy: Patient Spontanous Breathing and non-rebreather face mask  Post-op Assessment: Report given to RN and Post -op Vital signs reviewed and stable  Post vital signs: Reviewed and stable  Last Vitals:  Vitals:   12/19/17 0715 12/19/17 0720  BP: 134/80 123/72  Pulse:    Resp: 15 (!) 34  Temp:    SpO2: 91% 98%    Last Pain:  Vitals:   12/19/17 0632  TempSrc: Oral      Patients Stated Pain Goal: 7 (12/19/17 [id number])  Complications: No apparent anesthesia complications

## 2017-12-19 NOTE — Anesthesia Postprocedure Evaluation (Signed)
Anesthesia Post Note  Patient: Dionne AnoWilliam M Vandyne  Procedure(s) Performed: KNEE ARTHROSCOPY WITH MEDIAL MENISECTOMY (Right Knee) KNEE ARTHROSCOPY WITH LATERAL MENISECTOMY (Right Knee)  Patient location during evaluation: PACU Anesthesia Type: General Level of consciousness: awake and alert Pain management: satisfactory to patient Vital Signs Assessment: post-procedure vital signs reviewed and stable Respiratory status: spontaneous breathing Cardiovascular status: stable Postop Assessment: no apparent nausea or vomiting Anesthetic complications: no     Last Vitals:  Vitals:   12/19/17 0900 12/19/17 0911  BP: 128/80 135/78  Pulse: 79 67  Resp: 18   Temp:  36.6 C  SpO2: 95% 96%    Last Pain:  Vitals:   12/19/17 0911  TempSrc: Oral  PainSc: 3                  Weslee Fogg

## 2017-12-19 NOTE — Discharge Instructions (Signed)
Knee Arthroscopy, Care After °Refer to this sheet in the next few weeks. These instructions provide you with information about caring for yourself after your procedure. Your health care provider may also give you more specific instructions. Your treatment has been planned according to current medical practices, but problems sometimes occur. Call your health care provider if you have any problems or questions after your procedure. °What can I expect after the procedure? °After the procedure, it is common to have: °· Soreness. °· Pain. ° °Follow these instructions at home: °Bathing °· Do not take baths, swim, or use a hot tub until your health care provider approves. °Incision care °· There are many different ways to close and cover an incision, including stitches, skin glue, and adhesive strips. Follow your health care provider’s instructions about: °? Incision care. °? Bandage (dressing) changes and removal. °? Incision closure removal. °· Check your incision area every day for signs of infection. Watch for: °? Redness, swelling, or pain. °? Fluid, blood, or pus. °Activity °· Avoid strenuous activities for as long as directed by your health care provider. °· Return to your normal activities as directed by your health care provider. Ask your health care provider what activities are safe for you. °· Perform range-of-motion exercises only as directed by your health care provider. °· Do not lift anything that is heavier than 10 lb (4.5 kg). °· Do not drive or operate heavy machinery while taking pain medicine. °· If you were given crutches, use them as directed by your health care provider. °Managing pain, stiffness, and swelling °· If directed, apply ice to the injured area: °? Put ice in a plastic bag. °? Place a towel between your skin and the bag. °? Leave the ice on for 20 minutes, 2-3 times per day. °· Raise the injured area above the level of your heart while you are sitting or lying down as directed by your  health care provider. °General instructions °· Keep all follow-up visits as directed by your health care provider. This is important. °· Take medicines only as directed by your health care provider. °· Do not use any tobacco products, including cigarettes, chewing tobacco, or electronic cigarettes. If you need help quitting, ask your health care provider. °· If you were given compression stockings, wear them as directed by your health care provider. These stockings help prevent blood clots and reduce swelling in your legs. °Contact a health care provider if: °· You have severe pain with any movement of your knee. °· You notice a bad smell coming from the incision or dressing. °· You have redness, swelling, or pain at the site of your incision. °· You have fluid, blood, or pus coming from your incision. °Get help right away if: °· You develop a rash. °· You have a fever. °· You have difficulty breathing or have shortness of breath. °· You develop pain in your calves or in the back of your knee. °· You develop chest pain. °· You develop numbness or tingling in your leg or foot. °This information is not intended to replace advice given to you by your health care provider. Make sure you discuss any questions you have with your health care provider. °Document Released: 07/06/2005 Document Revised: 05/18/2016 Document Reviewed: 12/13/2014 °Elsevier Interactive Patient Education © 2018 [employer] ° °

## 2017-12-19 NOTE — Anesthesia Preprocedure Evaluation (Signed)
Anesthesia Evaluation  Patient identified by MRN, date of birth, ID band Patient awake    Reviewed: Allergy & Precautions, NPO status , Patient's Chart, lab work & pertinent test results, reviewed documented beta blocker date and time   Airway Mallampati: II  TM Distance: >3 FB Neck ROM: Full    Dental  (+) Teeth Intact   Pulmonary former smoker,    breath sounds clear to auscultation       Cardiovascular hypertension, Pt. on medications and Pt. on home beta blockers + CAD and + Past MI   Rhythm:Regular Rate:Normal     Neuro/Psych negative neurological ROS  negative psych ROS   GI/Hepatic GERD  Medicated and Poorly Controlled,  Endo/Other  Morbid obesity  Renal/GU      Musculoskeletal   Abdominal   Peds  Hematology   Anesthesia Other Findings   Reproductive/Obstetrics                             Anesthesia Physical Anesthesia Plan  ASA: III  Anesthesia Plan: General   Post-op Pain Management:    Induction: Intravenous, Rapid sequence and Cricoid pressure planned  PONV Risk Score and Plan:   Airway Management Planned: Oral ETT  Additional Equipment:   Intra-op Plan:   Post-operative Plan: Extubation in OR  Informed Consent: I have reviewed the patients History and Physical, chart, labs and discussed the procedure including the risks, benefits and alternatives for the proposed anesthesia with the patient or authorized representative who has indicated his/her understanding and acceptance.     Plan Discussed with:   Anesthesia Plan Comments:         Anesthesia Quick Evaluation

## 2017-12-19 NOTE — Interval H&P Note (Signed)
History and Physical Interval Note:  12/19/2017 7:19 AM  Tony Cannon  has presented today for surgery, with the diagnosis of Medial and lateral meniscectomy right knee, arthroscopy right knee  The various methods of treatment have been discussed with the patient and family. After consideration of risks, benefits and other options for treatment, the patient has consented to  Procedure(s): KNEE ARTHROSCOPY WITH MEDIAL MENISECTOMY (Right) KNEE ARTHROSCOPY WITH LATERAL MENISECTOMY (Right) as a surgical intervention .  The patient's history has been reviewed, patient examined, no change in status, stable for surgery.  I have reviewed the patient's chart and labs.  Questions were answered to the patient's satisfaction.    CBC Latest Ref Rng & Units 12/16/2017  WBC 4.0 - 10.5 K/uL 6.8  Hemoglobin 13.0 - 17.0 g/dL 12.9(L)  Hematocrit 39.0 - 52.0 % 41.0  Platelets 150 - 400 K/uL 289    BMP Latest Ref Rng & Units 12/16/2017 10/13/2015 05/10/2011  Glucose 65 - 99 mg/dL 99 295(A119(H) 213(Y100(H)  BUN 6 - 20 mg/dL 15 15 12   Creatinine 0.61 - 1.24 mg/dL 8.651.09 7.840.92 1.0  Sodium 696135 - 145 mmol/L 137 138 138  Potassium 3.5 - 5.1 mmol/L 3.8 4.1 4.3  Chloride 101 - 111 mmol/L 104 106 104  CO2 22 - 32 mmol/L 25 25 27   Calcium 8.9 - 10.3 mg/dL 9.3 9.2 9.7   BP 295/28139/75   Pulse 85   Temp 98.2 F (36.8 C) (Oral)   Resp 18   Ht 6' (1.829 m)   Wt (!) 304 lb (137.9 kg)   SpO2 97%   BMI 41.23 kg/m   [name]

## 2017-12-19 NOTE — Brief Op Note (Signed)
12/19/2017  8:29 AM  PATIENT:  Tony Cannon  61 y.o. male  PRE-OPERATIVE DIAGNOSIS:  medial and lateral meniscal tear right knee  POST-OPERATIVE DIAGNOSIS:  medial and lateral meniscal tear right knee  PROCEDURE:  Procedure(s): KNEE ARTHROSCOPY WITH MEDIAL MENISECTOMY (Right) KNEE ARTHROSCOPY WITH LATERAL MENISECTOMY (Right)  29880  Knee arthroscopy dictation  The patient was identified in the preoperative holding area using 2 approved identification mechanisms. The chart was reviewed and updated. The surgical site was confirmed as right knee and marked with an indelible marker.  The patient was taken to the operating room for anesthesia. After successful  general anesthesia, ancef was used as IV antibiotics.  The patient was placed in the supine position with the (right) the operative extremity in an arthroscopic leg holder and the opposite extremity in a padded leg holder.  The timeout was executed.  A lateral portal was established with an 11 blade and the scope was introduced into the joint. A diagnostic arthroscopy was performed in circumferential manner examining the entire knee joint. A medial portal was established and the diagnostic arthroscopy was repeated using a probe to palpate intra-articular structures as they were encountered.   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 The meniscal fragments were removed with a motorized shaver. The meniscus was balanced with a combination of a motorized shaver and a 50  wand until a stable rim was obtained.  The lateral meniscus was also resected with a duckbill forceps meniscal fragments were  removed with a motorized shaver and the meniscus was balanced with the motorized shaver and an 50 degree wand  The arthroscopic pump was placed on the wash mode and any excess debris was removed from the joint using suction.  60 cc of Marcaine with epinephrine was injected through the arthroscope.  The portals were closed with 3-0 nylon suture.  A sterile bandage, Ace wrap and Cryo/Cuff was placed and the Cryo/Cuff was activated. The patient was taken to the recovery room in stable condition.   SURGEON:  Surgeon(s) and Role:    * Vickki HearingHarrison, Magnolia Mattila E, MD - Primary  PHYSICIAN ASSISTANT:   ASSISTANTS: none   ANESTHESIA:   general  EBL:  0 mL   BLOOD ADMINISTERED:none  DRAINS: none   LOCAL MEDICATIONS USED:  MARCAINE     SPECIMEN:  No Specimen  DISPOSITION OF SPECIMEN:  N/A  COUNTS:  YES  TOURNIQUET:  * Missing tourniquet times found for documented tourniquets in log: [id number] *  DICTATION: .Dragon Dictation  PLAN OF CARE: Discharge to home after PACU  PATIENT DISPOSITION:  PACU - hemodynamically stable.   Delay start of Pharmacological VTE agent (>24hrs) due to surgical blood loss or risk of bleeding: not applicable

## 2017-12-20 ENCOUNTER — Encounter (HOSPITAL_COMMUNITY): Payer: Self-pay | Admitting: Orthopedic Surgery

## 2017-12-25 DIAGNOSIS — Z9889 Other specified postprocedural states: Secondary | ICD-10-CM | POA: Insufficient documentation

## 2017-12-26 ENCOUNTER — Telehealth: Payer: Self-pay | Admitting: Radiology

## 2017-12-26 NOTE — Telephone Encounter (Signed)
I spoke to patient, he is feeling well after his knee scope, he has asked about driving. Advised ok as long as he does not have pain with driving, and he is not using narcotics.

## 2018-01-02 ENCOUNTER — Ambulatory Visit (INDEPENDENT_AMBULATORY_CARE_PROVIDER_SITE_OTHER): Payer: BLUE CROSS/BLUE SHIELD | Admitting: Orthopedic Surgery

## 2018-01-02 DIAGNOSIS — Z9889 Other specified postprocedural states: Secondary | ICD-10-CM

## 2018-01-02 NOTE — Patient Instructions (Signed)
RETURN TO WORK Monday   

## 2018-01-02 NOTE — Progress Notes (Signed)
Postop visit #1 right knee arthroscopy  Date of surgery December 20  Postop day #13  Patient came in today walking unsupported he says he had one bad day right after surgery and then since that time he is been asymptomatic  His portal sites are clean the sutures were taken out they were cleaned with alcohol and Betadine and 2 Band-Aids were applied  He will continue his range of motion program I added some strength exercises  He can return to work on Monday, January 7 and follow-up with me in 1 month  Encounter Diagnosis  Name Primary?  . S/P right knee arthroscopy 12/19/17 Yes

## 2018-01-03 ENCOUNTER — Ambulatory Visit: Payer: BLUE CROSS/BLUE SHIELD | Admitting: Orthopedic Surgery

## 2018-01-09 ENCOUNTER — Other Ambulatory Visit: Payer: Self-pay | Admitting: Cardiology

## 2018-01-13 ENCOUNTER — Ambulatory Visit (INDEPENDENT_AMBULATORY_CARE_PROVIDER_SITE_OTHER): Payer: BLUE CROSS/BLUE SHIELD | Admitting: Orthopedic Surgery

## 2018-01-13 DIAGNOSIS — Z9889 Other specified postprocedural states: Secondary | ICD-10-CM

## 2018-01-13 NOTE — Progress Notes (Signed)
Patient walked in today, states retained stitch in portal/ right knee. There was not a retained stitch. There was a small scab over the portal site, it was removed without difficulty. Reassurance provided. Bandaid applied, patient advised to let us know if he has any problems. There was no redness, pain, or other problems with either portal site.

## 2018-01-13 NOTE — Patient Instructions (Addendum)
Keep follow up as scheduled.

## 2018-01-16 IMAGING — MR MR KNEE*R* W/O CM
5 of 6 series · 33 of 40 positions shown · non-contrast
Comparison: Right knee x-rays dated October 21, 2017.

CLINICAL DATA: Medial right knee pain and weakness for the past 6
months. No known injury.

EXAM:
MRI OF THE RIGHT KNEE WITHOUT CONTRAST
TECHNIQUE: Multiplanar, multisequence MR imaging of the knee was performed. No
intravenous contrast was administered.

[Series 6: PD fat-sat · axial · right · 3.0mm · 0.39mm/px · z∈[-30,+98]mm · 9 of 40 slices shown (1 of 3)]
[im 1/40]
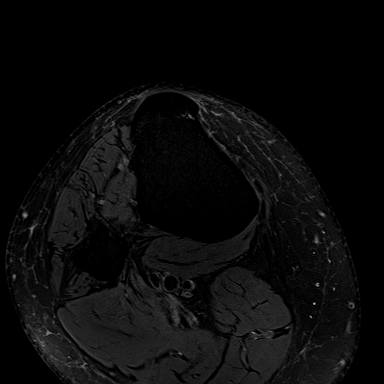
[im 5/40]
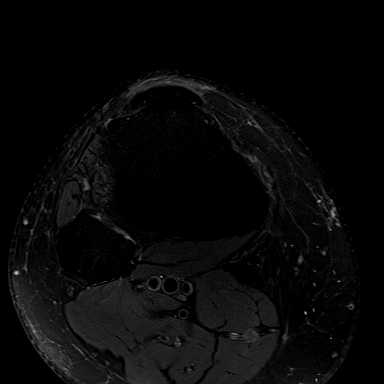
[im 10/40]
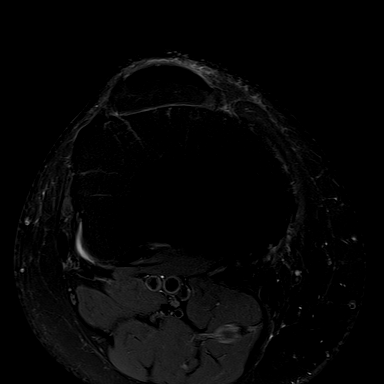
[im 15/40]
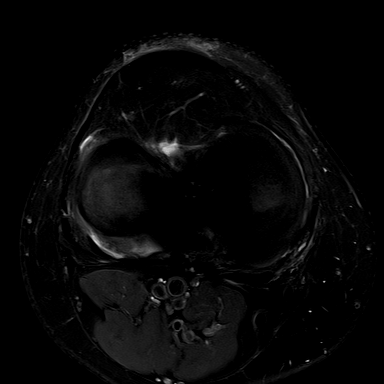
[im 20/40]
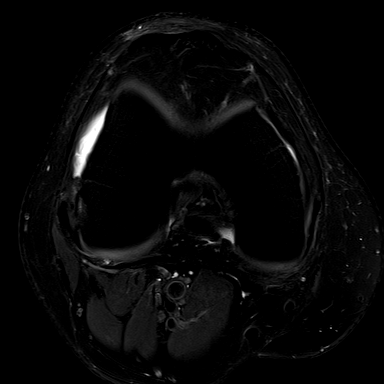
[im 25/40]
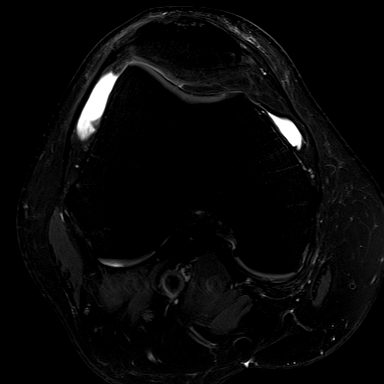
[im 30/40]
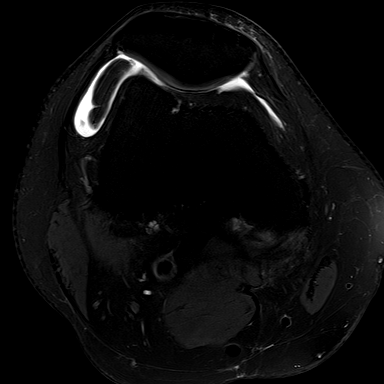
[im 35/40]
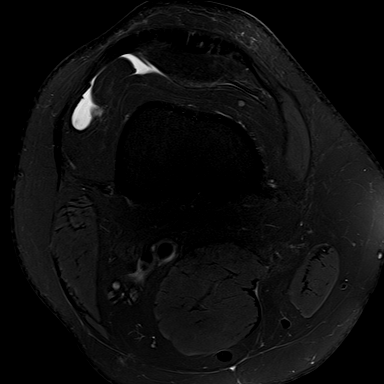
[im 40/40]
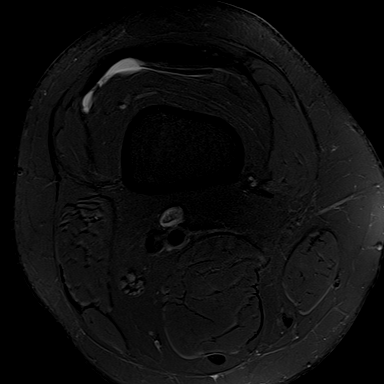

[Series 8: PD fat-sat · coronal · right · 3.0mm · 0.33mm/px · 7 of 33 slices shown (2 of 3)]
[im 1/33]
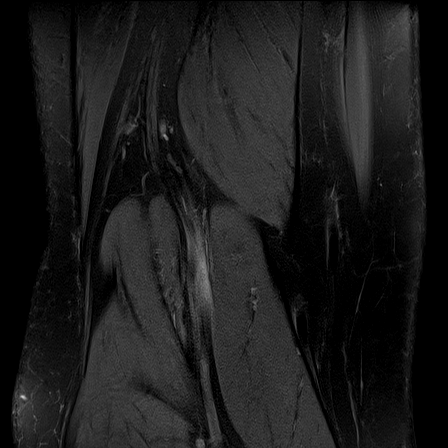
[im 6/33]
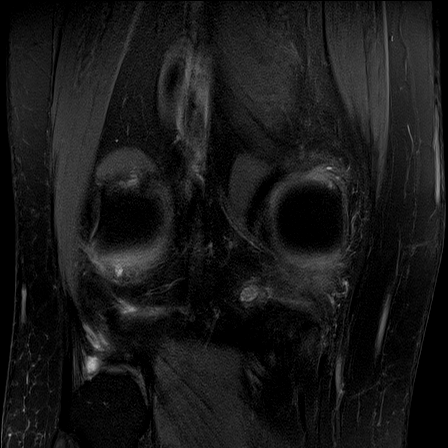
[im 11/33]
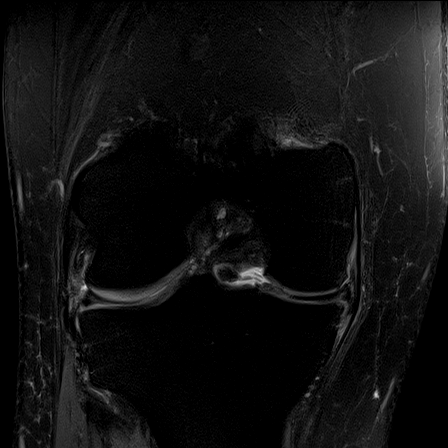
[im 17/33]
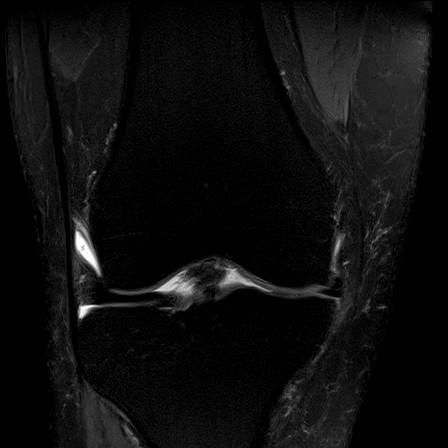
[im 22/33]
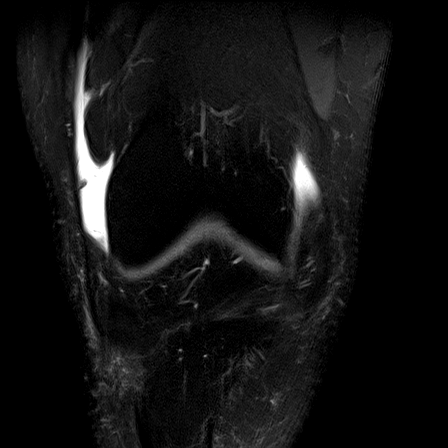
[im 27/33]
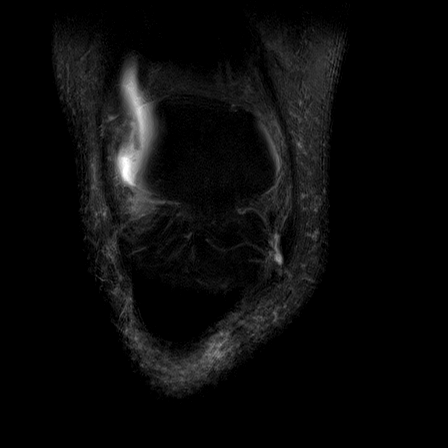
[im 33/33]
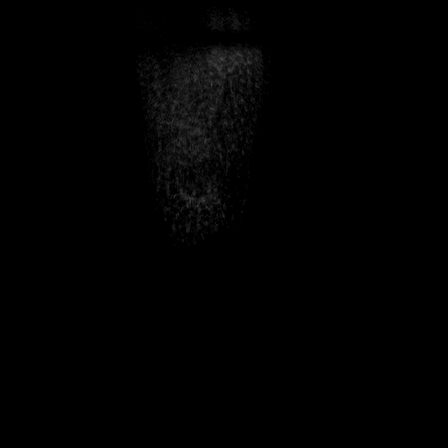

[Series 9: PD fat-sat · sagittal · right · 3.0mm · 0.39mm/px · 6 of 30 slices shown (3 of 3)]
[im 1/30]
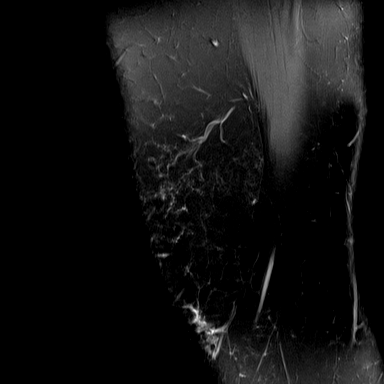
[im 6/30]
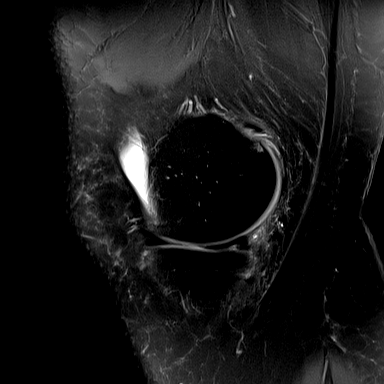
[im 12/30]
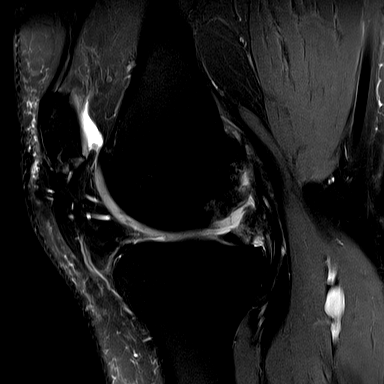
[im 18/30]
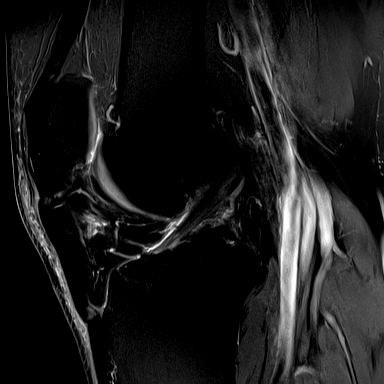
[im 24/30]
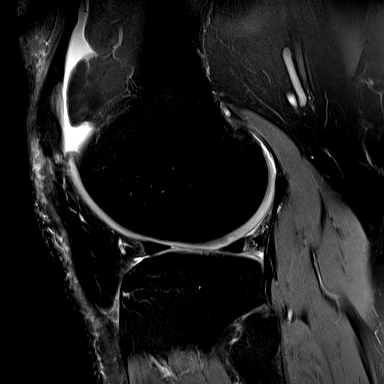
[im 30/30]
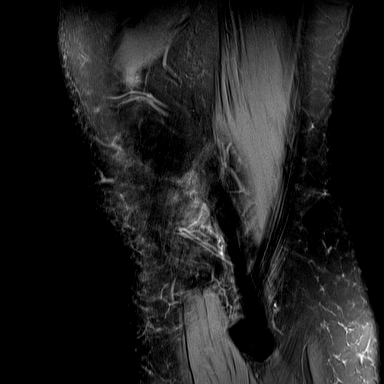

[Series 10: T2 fat-sat · coronal · right · 3.0mm · 0.39mm/px · 7 of 33 slices shown]
[im 1/33]
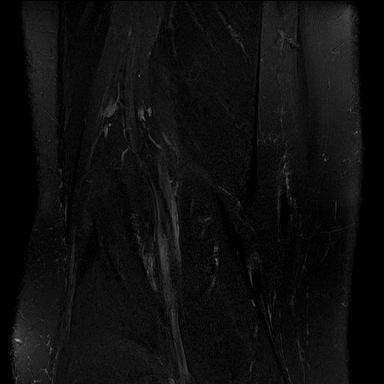
[im 6/33]
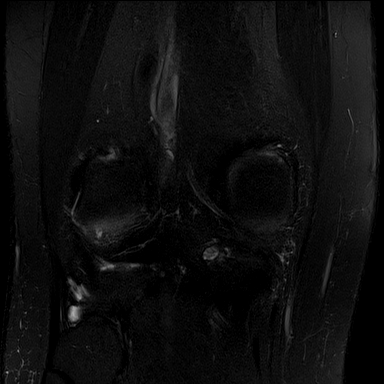
[im 11/33]
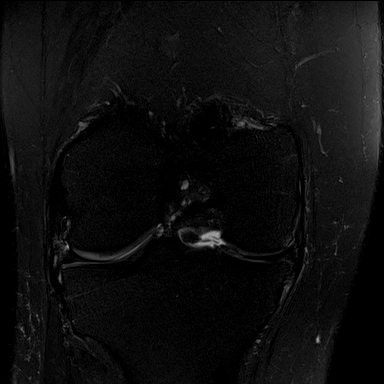
[im 17/33]
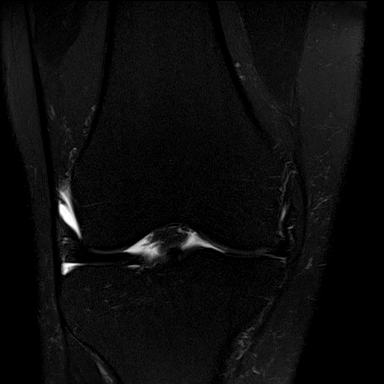
[im 22/33]
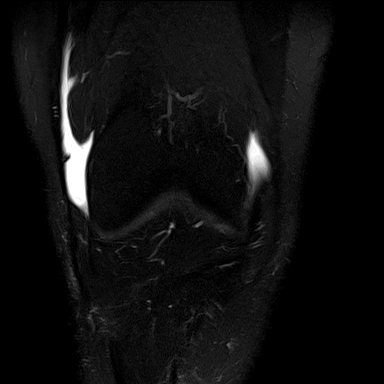
[im 27/33]
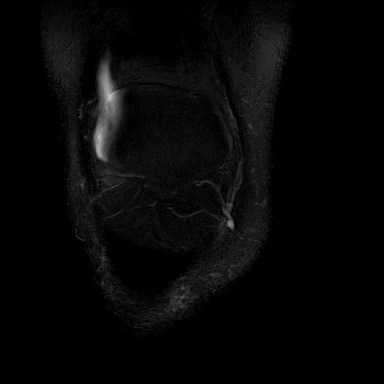
[im 33/33]
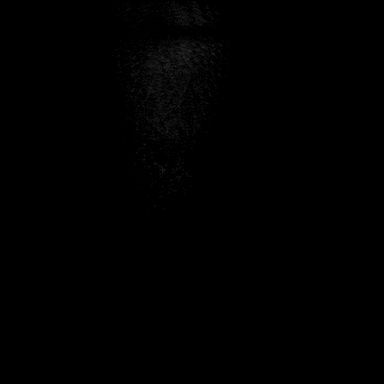

[Series 11: PD · oblique · right · 1.5mm · 0.44mm/px · 4 of 21 slices shown]
[im 1/21]
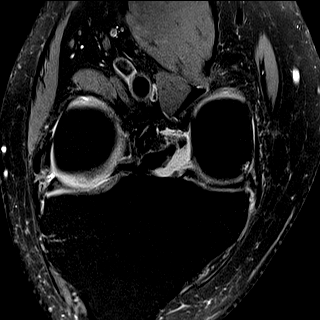
[im 7/21]
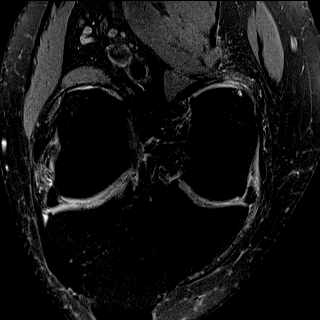
[im 14/21]
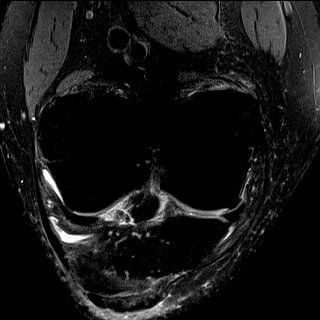
[im 21/21]
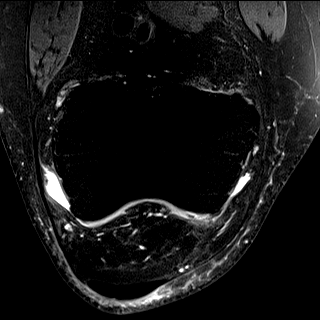

[33 of 40 positions shown; findings below may reference images not displayed]

FINDINGS: MENISCI

Medial meniscus: Horizontal tear of the posterior horn extending
into the posterior body.

Lateral meniscus: Horizontal tear of the posterior horn extending
into the posterior body.

LIGAMENTS

Cruciates:  Intact ACL and PCL.

Collaterals: Medial collateral ligament is intact. Lateral
collateral ligament complex is intact.

CARTILAGE

Patellofemoral: Partial-thickness cartilage loss over the superior
aspect of the medial trochlea.

Medial: Mild superficial irregularity over the central
weight-bearing medial femoral condyle. No focal defect.

Lateral:  No chondral defect.

Joint:  Small joint effusion.  Small medial plica.

Popliteal Fossa: No significant Baker cyst. Intact popliteus tendon.

Extensor Mechanism: Mild distal patellar tendinosis. Intact
quadriceps tendon and patellar tendon.

Bones: Tiny focus of subchondral marrow edema in the peripheral
medial femoral condyle. Upper pole No fracture or dislocation.

Other: None.
IMPRESSION: 1. Horizontal tears of the medial and lateral posterior horns
extending into the posterior bodies.
2. Minimal degenerative changes of the medial patellofemoral
compartment.

## 2018-01-31 ENCOUNTER — Encounter: Payer: Self-pay | Admitting: Orthopedic Surgery

## 2018-01-31 ENCOUNTER — Ambulatory Visit (INDEPENDENT_AMBULATORY_CARE_PROVIDER_SITE_OTHER): Payer: BLUE CROSS/BLUE SHIELD | Admitting: Orthopedic Surgery

## 2018-01-31 VITALS — BP 183/110 | HR 73 | Ht 72.0 in | Wt 303.0 lb

## 2018-01-31 DIAGNOSIS — Z9889 Other specified postprocedural states: Secondary | ICD-10-CM

## 2018-01-31 NOTE — Progress Notes (Signed)
POST OP VISIT   Patient ID: Tony Cannon, male   DOB: 07/24/1956, 62 y.o.   MRN: [id number]  Chief Complaint  Patient presents with  . Post-op Follow-up    right knee arthroscopy 12/19/17 patient feels better    Encounter Diagnosis  Name Primary?  . S/P right knee arthroscopy 12/19/17 Yes     Findings at surgery:   12/19/2017  8:29 AM  PATIENT:  Tony Cannon  62 y.o. 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 has no pain he is feeling great his knee looks good he has full range of motion as a normal ambulatory pattern I released him come back if any problems  Encounter Diagnosis  Name Primary?  . S/P right knee arthroscopy 12/19/17 Yes

## 2018-04-21 ENCOUNTER — Other Ambulatory Visit: Payer: Self-pay | Admitting: Cardiology

## 2018-09-02 ENCOUNTER — Telehealth: Payer: Self-pay | Admitting: Orthopedic Surgery

## 2018-09-02 NOTE — Telephone Encounter (Signed)
Tony Cannon called and wanted an appointment for his left lower leg.  I told him that we really didn't have an openings until the end of the month for a new problem.  He said that a limb fell on his leg about a week ago.  I asked him if he had been to the ED.  He said he had not.  I suggested that maybe he should go to the ED and get evaluated and then see what his next step should be.  He agreed and said he would do this.

## 2019-01-22 ENCOUNTER — Other Ambulatory Visit: Payer: Self-pay | Admitting: Cardiology

## 2019-03-13 ENCOUNTER — Other Ambulatory Visit: Payer: Self-pay | Admitting: Cardiology

## 2019-05-01 ENCOUNTER — Other Ambulatory Visit: Payer: Self-pay | Admitting: Cardiology

## 2019-06-17 ENCOUNTER — Other Ambulatory Visit: Payer: Self-pay | Admitting: Cardiology

## 2019-06-19 ENCOUNTER — Other Ambulatory Visit: Payer: Self-pay | Admitting: Cardiology

## 2019-07-31 ENCOUNTER — Other Ambulatory Visit: Payer: Self-pay | Admitting: Cardiology

## 2019-09-15 ENCOUNTER — Telehealth: Payer: Self-pay | Admitting: Cardiology

## 2019-09-15 NOTE — Telephone Encounter (Signed)
Virtual Visit Pre-Appointment Phone Call  "(Name), I am calling you today to discuss your upcoming appointment. We are currently trying to limit exposure to the virus that causes COVID-19 by seeing patients at home rather than in the office."  1. "What is the BEST phone number to call the day of the visit?" - include this in appointment notes  2. Do you have or have access to (through a family member/friend) a smartphone with video capability that we can use for your visit?" a. If yes - list this number in appt notes as cell (if different from BEST phone #) and list the appointment type as a VIDEO visit in appointment notes b. If no - list the appointment type as a PHONE visit in appointment notes  3. Confirm consent - "In the setting of the current Covid19 crisis, you are scheduled for a (phone or video) visit with your provider on (date) at (time).  Just as we do with many in-office visits, in order for you to participate in this visit, we must obtain consent.  If you'd like, I can send this to your mychart (if signed up) or email for you to review.  Otherwise, I can obtain your verbal consent now.  All virtual visits are billed to your insurance company just like a normal visit would be.  By agreeing to a virtual visit, we'd like you to understand that the technology does not allow for your provider to perform an examination, and thus may limit your provider's ability to fully assess your condition. If your provider identifies any concerns that need to be evaluated in person, we will make arrangements to do so.  Finally, though the technology is pretty good, we cannot assure that it will always work on either your or our end, and in the setting of a video visit, we may have to convert it to a phone-only visit.  In either situation, we cannot ensure that we have a secure connection.  Are you willing to proceed?" STAFF: Did the patient verbally acknowledge consent to telehealth visit? Document  YES/NO here: Yes  4. Advise patient to be prepared - "Two hours prior to your appointment, go ahead and check your blood pressure, pulse, oxygen saturation, and your weight (if you have the equipment to check those) and write them all down. When your visit starts, your provider will ask you for this information. If you have an Apple Watch or Kardia device, please plan to have heart rate information ready on the day of your appointment. Please have a pen and paper handy nearby the day of the visit as well."  5. Give patient instructions for MyChart download to smartphone OR [email or web address] as below if video visit (depending on what platform provider is using)  6. Inform patient they will receive a phone call 15 minutes prior to their appointment time (may be from unknown caller ID) so they should be prepared to answer    TELEPHONE CALL NOTE  Tony Cannon has been deemed a candidate for a follow-up tele-health visit to limit community exposure during the Covid-19 pandemic. I spoke with the patient via phone to ensure availability of phone/video source, confirm preferred email & phone number, and discuss instructions and expectations.  I reminded Tony Cannon to be prepared with any vital sign and/or heart rhythm information that could potentially be obtained via home monitoring, at the time of his visit. I reminded Tony Cannon to expect a phone call prior to  his visit.  [name] 09/15/2019 9:18 AM

## 2019-09-16 ENCOUNTER — Telehealth (INDEPENDENT_AMBULATORY_CARE_PROVIDER_SITE_OTHER): Payer: BC Managed Care – PPO | Admitting: Cardiology

## 2019-09-16 ENCOUNTER — Other Ambulatory Visit: Payer: Self-pay

## 2019-09-16 ENCOUNTER — Encounter: Payer: Self-pay | Admitting: Cardiology

## 2019-09-16 DIAGNOSIS — I2510 Atherosclerotic heart disease of native coronary artery without angina pectoris: Secondary | ICD-10-CM | POA: Diagnosis not present

## 2019-09-16 DIAGNOSIS — E782 Mixed hyperlipidemia: Secondary | ICD-10-CM

## 2019-09-16 DIAGNOSIS — I10 Essential (primary) hypertension: Secondary | ICD-10-CM

## 2019-09-16 MED ORDER — LISINOPRIL 40 MG PO TABS: ORAL_TABLET | ORAL | 3 refills | Status: DC

## 2019-09-16 MED ORDER — PRAVASTATIN SODIUM 40 MG PO TABS: 40.0000 mg | ORAL_TABLET | Freq: Every evening | ORAL | 3 refills | Status: DC

## 2019-09-16 MED ORDER — METOPROLOL SUCCINATE ER 25 MG PO TB24: 12.5000 mg | ORAL_TABLET | Freq: Every day | ORAL | 3 refills | Status: DC

## 2019-09-16 NOTE — Progress Notes (Signed)
Virtual Visit via Telephone Note   This visit type was conducted due to national recommendations for restrictions regarding the COVID-19 Pandemic (e.g. social distancing) in an effort to limit this patient's exposure and mitigate transmission in our community.  Due to his co-morbid illnesses, this patient is at least at moderate risk for complications without adequate follow up.  This format is felt to be most appropriate for this patient at this time.  The patient did not have access to video technology/had technical difficulties with video requiring transitioning to audio format only (telephone).  All issues noted in this document were discussed and addressed.  No physical exam could be performed with this format.  Please refer to the patient's chart for his  consent to telehealth for [facility].   Date:  09/16/2019   ID:  Tony Cannon, DOB 1956/04/09, MRN [id number]  Patient Location: Home Provider Location: Home  PCP:  Mirna Mires, MD  Cardiologist:  Dr Dina Rich MD Electrophysiologist:  None   Evaluation Performed:  Follow-Up Visit  Chief Complaint:  Follow up  History of Present Illness:    Tony Cannon is a 63 y.o. male seen today for follow up of the following medical problems.   1. CAD - noted multivessel disease in 2002, due to anatomy of lesions according to notes multivessel PCI was thought best intervention.  -03/2001 stent to LAD and LCX, 05/2001 stent to RCA. Cath with normal LVEF at that time.  - completed echo which showed LVEF 55-60%, grade I diastolic dysfunction - 10/2015 exercise MPI showed inferior scar with mild peri-infarct ischemia, LVEF 47%. Exercised 10 minutes with no EKG changes, Duke treadmill score of 10 consistent with low risk for major cardiac events.  - imdur caused headaches, it was stopped   - no recent chest pain - no sob/doe - compliant with meds.   2. Hyperlipidemia - had a rash with lipitor, has tolerated mild  statin.  - he reports no recent labs with pcp - compliant with statin  3. HTN - remains compliant with meds   SH: works as Midwife  The patient does not have symptoms concerning for COVID-19 infection (fever, chills, cough, or new shortness of breath).    Past Medical History:  Diagnosis Date  . Coronary atherosclerosis of native coronary artery   . Essential hypertension, benign   . History of gout   . Old myocardial infarction   . Other and unspecified hyperlipidemia    Past Surgical History:  Procedure Laterality Date  . CARDIAC CATHETERIZATION    . COLONOSCOPY N/A 07/08/2017   Procedure: COLONOSCOPY;  Surgeon: Corbin Ade, MD;  [facility];  Service: Endoscopy;  Laterality: N/A;  2:15 pm  . KNEE ARTHROSCOPY WITH LATERAL MENISECTOMY Right 12/19/2017   Procedure: KNEE ARTHROSCOPY WITH LATERAL MENISECTOMY;  Surgeon: Vickki Hearing, MD;  Location: [facility];  Service: Orthopedics;  Laterality: Right;  . KNEE ARTHROSCOPY WITH MEDIAL MENISECTOMY Right 12/19/2017   Procedure: KNEE ARTHROSCOPY WITH MEDIAL MENISECTOMY;  Surgeon: Vickki Hearing, MD;  Location: [facility];  Service: Orthopedics;  Laterality: Right;  . POLYPECTOMY  07/08/2017   Procedure: POLYPECTOMY;  Surgeon: Corbin Ade, MD;  Location[facility];  Service: Endoscopy;;  colon     No outpatient medications have been marked as taking for the 09/16/19 encounter (Appointment) with Antoine Poche, MD.     Allergies:   Patient has no known allergies.   Social History   Tobacco  Use  . Smoking status: Former Smoker    Packs/day: 1.50    Years: 25.00    Pack years: 37.50    Types: Cigarettes    Quit date: 08/20/1999    Years since quitting: 20.0  . Smokeless tobacco: Never Used  Substance Use Topics  . Alcohol use: Yes    Alcohol/week: 14.0 standard drinks    Types: 14 Cans of beer per week  . Drug use: No     Family Hx: The patient's family history is not on file.  ROS:    Please see the history of present illness.     All other systems reviewed and are negative.   Prior CV studies:   The following studies were reviewed today:  10/2015 echo Study Conclusions  - Left ventricle: The cavity size was normal. Wall thickness was increased in a pattern of mild LVH. There was severe focal basal hypertrophy of the septum. Systolic function was normal. The estimated ejection fraction was in the range of 55% to 60%. There is hypokinesis of the basal-midinferolateral and inferior myocardium. Doppler parameters are consistent with abnormal left ventricular relaxation (grade 1 diastolic dysfunction). - Right atrium: Central venous pressure (est): 3 mm Hg. - Atrial septum: No defect or patent foramen ovale was identified. - Tricuspid valve: There was physiologic regurgitation. - Pulmonary arteries: Systolic pressure could not be accurately estimated. - Pericardium, extracardiac: There was no pericardial effusion.  Impressions:  - Mild LVH with severe septal hypertrophy, LVEF 55-60%. Mid to basal inferior/inferolateral hypokinesis. Grade 1 diastolic dysfunction.  10/2015 Exercise MPI  Blood pressure demonstrated a hypertensive response to exercise.  There was no ST segment deviation noted during stress. Frequent PVC's noted.  Defect 1: There is a medium defect of severe severity present in the basal inferoseptal, basal inferior, basal inferolateral, mid inferior, mid inferolateral, apical inferior and apical lateral location.  Findings consistent with prior myocardial infarction with peri-infarct ischemia. Specifically, there is myocardial scar with a mild degree of peri-infarct ischemia extending from the apex to the base and also involving the inferoseptal and inferolateral walls.  This is an intermediate risk study.  Nuclear stress EF: 47%.  Labs/Other Tests and Data Reviewed:    EKG:  No ECG reviewed.  Recent Labs: No results  found for requested labs within last 8760 hours.   Recent Lipid Panel Lab Results  Component Value Date/Time   CHOL 333 (H) 07/09/2013 10:04 AM   TRIG 278.0 (H) 07/09/2013 10:04 AM   HDL 41.90 07/09/2013 10:04 AM   CHOLHDL 8 07/09/2013 10:04 AM   LDLCALC 117 (H) 06/27/2012 08:13 AM   LDLDIRECT 235.4 07/09/2013 10:04 AM    Wt Readings from Last 3 Encounters:  01/31/18 (!) 303 lb (137.4 kg)  12/19/17 (!) 304 lb (137.9 kg)  12/16/17 (!) 304 lb (137.9 kg)     Objective:    Vital Signs:   Today's Vitals   09/16/19 1007  Weight: 293 lb (132.9 kg)  Height: 6\' 1"  (1.854 m)   Body mass index is 38.66 kg/m.  Normal affect. Normal speech pattern and tone. Comfortable, no apparent distress. No audible signs of SOb or breathing.   ASSESSMENT & PLAN:    1. CAD - no recent symptoms, conitnue current meds  2. Hyperlipidemia - repeat labs, continue statin.   3. HTN - continue current meds  COVID-19 Education: The signs and symptoms of COVID-19 were discussed with the patient and how to seek care for testing (follow up with PCP  or arrange E-visit).  The importance of social distancing was discussed today.  Time:   Today, I have spent 16 minutes with the patient with telehealth technology discussing the above problems.     Medication Adjustments/Labs and Tests Ordered: Current medicines are reviewed at length with the patient today.  Concerns regarding medicines are outlined above.   Tests Ordered: No orders of the defined types were placed in this encounter.   Medication Changes: No orders of the defined types were placed in this encounter.   Follow Up:  In Person in 6 month(s)  Signed, Dina RichBranch, Khala Tarte, MD  09/16/2019 8:16 AM    [facility]

## 2019-09-16 NOTE — Patient Instructions (Signed)
Medication Instructions:  Your physician recommends that you continue on your current medications as directed. Please refer to the Current Medication list given to you today.   Labwork: As SOON AS POSSIBLE   Testing/Procedures: NONE  Follow-Up: Your physician wants you to follow-up in: 6 MONTHS.  You will receive a reminder letter in the mail two months in advance. If you don't receive a letter, please call our office to schedule the follow-up appointment.   Any Other Special Instructions Will Be Listed Below (If Applicable).     If you need a refill on your cardiac medications before your next appointment, please call your pharmacy.

## 2019-09-16 NOTE — Addendum Note (Signed)
Addended by: Debbora Lacrosse R on: 09/16/2019 10:36 AM   Modules accepted: Orders

## 2019-11-12 ENCOUNTER — Other Ambulatory Visit: Payer: Self-pay | Admitting: Cardiology

## 2020-08-04 ENCOUNTER — Ambulatory Visit: Payer: BC Managed Care – PPO | Admitting: Cardiology

## 2020-08-09 ENCOUNTER — Ambulatory Visit (INDEPENDENT_AMBULATORY_CARE_PROVIDER_SITE_OTHER): Payer: BC Managed Care – PPO | Admitting: Cardiology

## 2020-08-09 ENCOUNTER — Encounter: Payer: Self-pay | Admitting: Cardiology

## 2020-08-09 VITALS — BP 128/78 | HR 72 | Ht 73.0 in | Wt 303.0 lb

## 2020-08-09 DIAGNOSIS — E782 Mixed hyperlipidemia: Secondary | ICD-10-CM | POA: Diagnosis not present

## 2020-08-09 DIAGNOSIS — I10 Essential (primary) hypertension: Secondary | ICD-10-CM

## 2020-08-09 DIAGNOSIS — I2510 Atherosclerotic heart disease of native coronary artery without angina pectoris: Secondary | ICD-10-CM | POA: Diagnosis not present

## 2020-08-09 NOTE — Progress Notes (Signed)
Clinical Summary Mr. Renault is a 64 y.o.male seen today for follow up of the following medical problems.   1. CAD - noted multivessel disease in 2002, due to anatomy of lesions according to notes multivessel PCI was thought best intervention.  -03/2001 stent to LAD and LCX, 05/2001 stent to RCA. Cath with normal LVEF at that time.  - completed echo which showed LVEF 55-60%, grade I diastolic dysfunction -10/2016exercise MPI showed inferior scar with mild peri-infarct ischemia, LVEF 47%. Exercised 10 minutes with no EKG changes, Duke treadmill score of 10 consistent with low risk for major cardiac events.  - imdur caused headaches, it was stopped    - no recent chest pain. No SOB/DOE - compliant with meds   2. Hyperlipidemia - had a rash with lipitor, has tolerated mild statin.   - no recent labs.   3. HTN -compliant with statin.   4. OSA screen - he reports negative study 10 years - +snoring, occasional apneic episodes, no daytime somnolence.    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 Coronary atherosclerosis of native coronary artery   . Essential hypertension, benign   . History of gout   . Old myocardial infarction   . Other and unspecified hyperlipidemia      No Known Allergies   Current Outpatient Medications  Medication Sig Dispense Refill  . aspirin EC 81 MG tablet Take 81 mg by mouth daily.    . calcium carbonate (TUMS EX) 750 MG chewable tablet Chew 1 tablet by mouth as needed for heartburn.    [name] guaiFENesin (MUCINEX) 600 MG 12 hr tablet Take 600 mg by mouth daily.    [name] ibuprofen (ADVIL,MOTRIN) 800 MG tablet Take 1 tablet (800 mg total) by mouth every 8 (eight) hours as needed. 90 tablet 1  . lisinopril (ZESTRIL) 40 MG tablet TAKE 1 TABLET BY MOUTH ONCE DAILY 90 tablet 3  . metoprolol succinate (TOPROL-XL) 25 MG 24 hr tablet Take 1 tablet by mouth once daily 90 tablet 3  . Multiple  Vitamin (MULTIVITAMIN) capsule Take 1 capsule by mouth daily.    . nitroGLYCERIN (NITROSTAT) 0.4 MG SL tablet Place 0.4 mg under the tongue every 5 (five) minutes as needed for chest pain.    . pravastatin (PRAVACHOL) 40 MG tablet Take 1 tablet (40 mg total) by mouth every evening. 90 tablet 3   No current facility-administered medications for this visit.     Past Surgical History:  Procedure Laterality Date  . CARDIAC CATHETERIZATION    . COLONOSCOPY N/A 07/08/2017   Procedure: COLONOSCOPY;  Surgeon: Corbin Ade, MD;  [facility];  Service: Endoscopy;  Laterality: N/A;  2:15 pm  . KNEE ARTHROSCOPY WITH LATERAL MENISECTOMY Right 12/19/2017   Procedure: KNEE ARTHROSCOPY WITH LATERAL MENISECTOMY;  Surgeon: Vickki Hearing, MD;  Location: [facility];  Service: Orthopedics;  Laterality: Right;  . KNEE ARTHROSCOPY WITH MEDIAL MENISECTOMY Right 12/19/2017   Procedure: KNEE ARTHROSCOPY WITH MEDIAL MENISECTOMY;  Surgeon: Vickki Hearing, MD;  Location: [facility];  Service: Orthopedics;  Laterality: Right;  . POLYPECTOMY  07/08/2017   Procedure: POLYPECTOMY;  Surgeon: Corbin Ade, MD;  [facility];  Service: Endoscopy;;  colon     No Known Allergies    No family history on file.   Social History Mr. Hepp reports that he quit smoking about 20 years ago. His smoking use included cigarettes. He  has a 37.50 pack-year smoking history. He has never used smokeless tobacco. Mr. Kleckner reports current alcohol use of about 14.0 standard drinks of alcohol per week.   Review of Systems CONSTITUTIONAL: No weight loss, fever, chills, weakness or fatigue.  HEENT: Eyes: No visual loss, blurred vision, double vision or yellow sclerae.No hearing loss, sneezing, congestion, runny nose or sore throat.  SKIN: No rash or itching.  CARDIOVASCULAR: per hpi RESPIRATORY: No shortness of breath, cough or sputum.  GASTROINTESTINAL: No anorexia, nausea, vomiting or diarrhea. No  abdominal pain or blood.  GENITOURINARY: No burning on urination, no polyuria NEUROLOGICAL: No headache, dizziness, syncope, paralysis, ataxia, numbness or tingling in the extremities. No change in bowel or bladder control.  MUSCULOSKELETAL: No muscle, back pain, joint pain or stiffness.  LYMPHATICS: No enlarged nodes. No history of splenectomy.  PSYCHIATRIC: No history of depression or anxiety.  ENDOCRINOLOGIC: No reports of sweating, cold or heat intolerance. No polyuria or polydipsia.  [name]   Physical Examination Today's Vitals   08/09/20 1017  BP: 128/78  Pulse: 72  SpO2: 95%  Weight: (!) 303 lb (137.4 kg)  Height: 6\' 1"  (1.854 m)   Body mass index is 39.98 kg/m.  Gen: resting comfortably, no acute distress HEENT: no scleral icterus, pupils equal round and reactive, no palptable cervical adenopathy,  CV: RRR, no m/r/g, no jvd Resp: Clear to auscultation bilaterally GI: abdomen is soft, non-tender, non-distended, normal bowel sounds, no hepatosplenomegaly MSK: extremities are warm, no edema.  Skin: warm, no rash Neuro:  no focal deficits Psych: appropriate affect   Diagnostic Studies 10/2015 echo Study Conclusions  - Left ventricle: The cavity size was normal. Wall thickness was increased in a pattern of mild LVH. There was severe focal basal hypertrophy of the septum. Systolic function was normal. The estimated ejection fraction was in the range of 55% to 60%. There is hypokinesis of the basal-midinferolateral and inferior myocardium. Doppler parameters are consistent with abnormal left ventricular relaxation (grade 1 diastolic dysfunction). - Right atrium: Central venous pressure (est): 3 mm Hg. - Atrial septum: No defect or patent foramen ovale was identified. - Tricuspid valve: There was physiologic regurgitation. - Pulmonary arteries: Systolic pressure could not be accurately estimated. - Pericardium, extracardiac: There was no pericardial  effusion.  Impressions:  - Mild LVH with severe septal hypertrophy, LVEF 55-60%. Mid to basal inferior/inferolateral hypokinesis. Grade 1 diastolic dysfunction.  10/2015 Exercise MPI  Blood pressure demonstrated a hypertensive response to exercise.  There was no ST segment deviation noted during stress. Frequent PVC's noted.  Defect 1: There is a medium defect of severe severity present in the basal inferoseptal, basal inferior, basal inferolateral, mid inferior, mid inferolateral, apical inferior and apical lateral location.  Findings consistent with prior myocardial infarction with peri-infarct ischemia. Specifically, there is myocardial scar with a mild degree of peri-infarct ischemia extending from the apex to the base and also involving the inferoseptal and inferolateral walls.  This is an intermediate risk study.  Nuclear stress EF: 47%.    Assessment and Plan   1. CAD -denies any symptoms, continue current meds - EKG shows SR, isolated PVC, inferior and lateral precordial Qwaves, no acute ischemic changes  2. Hyperlipidemia - obtain labs continue statin  3. HTN - at goal, continue current meds  Obtain annual labs.   F/u 1 year   11/2015, M.D.

## 2020-08-09 NOTE — Patient Instructions (Signed)
Your physician wants you to follow-up in: 1 YEAR WITH DR [facility] You will receive a reminder letter in the mail two months in advance. If you don't receive a letter, please call our office to schedule the follow-up appointment.  Your physician recommends that you continue on your current medications as directed. Please refer to the Current Medication list given to you today.  Your physician recommends that you return for lab work PLEASE FAST 6-8 HOUR PRIOR TO LAB WORK CBC/BMP/LIPIDS/MG/TSH/HGBA1C  Thank you for choosing [facility]!!

## 2020-10-06 ENCOUNTER — Encounter: Payer: Self-pay | Admitting: *Deleted

## 2020-10-06 ENCOUNTER — Other Ambulatory Visit: Payer: Self-pay | Admitting: Cardiology

## 2020-10-07 LAB — HEMOGLOBIN A1C W/OUT EAG: Hgb A1c MFr Bld: 7.5 % of total Hgb — ABNORMAL HIGH (ref <5.7)

## 2020-10-07 LAB — BASIC METABOLIC PANEL WITH GFR
BUN: 11 mg/dL (ref 7–25)
CO2: 29 mmol/L (ref 20–32)
Calcium: 10.1 mg/dL (ref 8.6–10.3)
Chloride: 101 mmol/L (ref 98–110)
Creat: 0.82 mg/dL (ref 0.70–1.25)
GFR, Est African American: 110 mL/min/{1.73_m2} (ref >=60)
GFR, Est Non African American: 95 mL/min/{1.73_m2} (ref >=60)
Glucose, Bld: 121 mg/dL — ABNORMAL HIGH (ref 65–99)
Potassium: 4.3 mmol/L (ref 3.5–5.3)
Sodium: 138 mmol/L (ref 135–146)

## 2020-10-07 LAB — CBC
HCT: 41.1 % (ref 38.5–50.0)
Hemoglobin: 13.6 g/dL (ref 13.2–17.1)
MCH: 27.8 pg (ref 27.0–33.0)
MCHC: 33.1 g/dL (ref 32.0–36.0)
MCV: 84 fL (ref 80.0–100.0)
MPV: 9.8 fL (ref 7.5–12.5)
Platelets: 327 10*3/uL (ref 140–400)
RBC: 4.89 10*6/uL (ref 4.20–5.80)
RDW: 13.6 % (ref 11.0–15.0)
WBC: 5.7 10*3/uL (ref 3.8–10.8)

## 2020-10-07 LAB — LIPID PANEL
Cholesterol: 251 mg/dL — ABNORMAL HIGH (ref <200)
HDL: 36 mg/dL — ABNORMAL LOW (ref >=40)
LDL Cholesterol (Calc): 178 mg/dL (calc) — ABNORMAL HIGH
Non-HDL Cholesterol (Calc): 215 mg/dL (calc) — ABNORMAL HIGH (ref <130)
Total CHOL/HDL Ratio: 7 (calc) — ABNORMAL HIGH (ref <5.0)
Triglycerides: 210 mg/dL — ABNORMAL HIGH (ref <150)

## 2020-10-07 LAB — MAGNESIUM: Magnesium: 1.9 mg/dL (ref 1.5–2.5)

## 2020-10-07 LAB — TSH: TSH: 1.53 mIU/L (ref 0.40–4.50)

## 2020-10-10 ENCOUNTER — Other Ambulatory Visit: Payer: Self-pay | Admitting: Cardiology

## 2020-10-12 ENCOUNTER — Telehealth: Payer: Self-pay | Admitting: *Deleted

## 2020-10-12 MED ORDER — ROSUVASTATIN CALCIUM 20 MG PO TABS: 20.0000 mg | ORAL_TABLET | Freq: Every day | ORAL | 1 refills | Status: DC

## 2020-10-12 NOTE — Telephone Encounter (Signed)
Pt voiced understanding - updated medication list 

## 2020-10-12 NOTE — Telephone Encounter (Signed)
-----   Message from Antoine Poche, MD sent at 10/12/2020  3:38 PM EDT ----- Cholesterol is much too high. Can we stop pravastatin, start crestor 20mg  daily. Blood sugars above goal, please forward labs to pcp   J BrancH MD

## 2020-10-27 ENCOUNTER — Other Ambulatory Visit: Payer: Self-pay | Admitting: Cardiology

## 2020-10-31 ENCOUNTER — Ambulatory Visit: Payer: BC Managed Care – PPO

## 2020-10-31 ENCOUNTER — Encounter: Payer: Self-pay | Admitting: Orthopedic Surgery

## 2020-10-31 ENCOUNTER — Ambulatory Visit (INDEPENDENT_AMBULATORY_CARE_PROVIDER_SITE_OTHER): Payer: BC Managed Care – PPO | Admitting: Orthopedic Surgery

## 2020-10-31 ENCOUNTER — Other Ambulatory Visit: Payer: Self-pay

## 2020-10-31 VITALS — BP 174/88 | HR 61 | Ht 73.0 in | Wt 322.0 lb

## 2020-10-31 DIAGNOSIS — M25511 Pain in right shoulder: Secondary | ICD-10-CM

## 2020-10-31 DIAGNOSIS — M7541 Impingement syndrome of right shoulder: Secondary | ICD-10-CM | POA: Diagnosis not present

## 2020-10-31 DIAGNOSIS — Z6841 Body Mass Index (BMI) 40.0 and over, adult: Secondary | ICD-10-CM | POA: Diagnosis not present

## 2020-10-31 NOTE — Patient Instructions (Addendum)
Shoulder Impingement Syndrome  Shoulder impingement syndrome is a condition that causes pain when connective tissues (tendons) surrounding the shoulder joint become pinched. These tendons are part of the group of muscles and tissues that help to stabilize the shoulder (rotator cuff). Beneath the rotator cuff is a fluid-filled sac (bursa) that allows the muscles and tendons to glide smoothly. The bursa may become swollen or irritated (bursitis). Bursitis, swelling in the rotator cuff tendons, or both conditions can decrease how much space is under a bone in the shoulder joint (acromion), resulting in impingement. What are the causes? Shoulder impingement syndrome may be caused by bursitis or swelling of the rotator cuff tendons, which may result from:  Repetitive overhead arm movements.  Falling onto the shoulder.  Weakness in the shoulder muscles. What increases the risk? You may be more likely to develop this condition if you:  Play sports that involve throwing, such as baseball.  Participate in sports such as tennis, volleyball, and swimming.  Work as a Education administrator, Music therapist, or Pharmacologist. Some people are also more likely to develop impingement syndrome because of the shape of their acromion bone. What are the signs or symptoms? The main symptom of this condition is pain on the front or side of the shoulder. The pain may:  Get worse when lifting or raising the arm.  Get worse at night.  Wake you up from sleeping.  Feel sharp when the shoulder is moved and then fade to an ache. Other symptoms may include:  Tenderness.  Stiffness.  Inability to raise the arm above shoulder level or behind the body.  Weakness. How is this diagnosed? This condition may be diagnosed based on:  Your symptoms and medical history.  A physical exam.  Imaging tests, such as: ? X-rays. ? MRI. ? Ultrasound. How is this treated? This condition may be treated by:  Resting your shoulder  and avoiding all activities that cause pain or put stress on the shoulder.  Icing your shoulder.  NSAIDs to help reduce pain and swelling.  One or more injections of medicines to numb the area and reduce inflammation.  Physical therapy.  Surgery. This may be needed if nonsurgical treatments have not helped. Surgery may involve repairing the rotator cuff, reshaping the acromion, or removing the bursa. Follow these instructions at home: Managing pain, stiffness, and swelling    ? Put ice in a plastic bag. ? Place a towel between your skin and the bag. ? Leave the ice on for 20 minutes, 2-3 times a day. ? Ibuprofen 800 mg 3 x a day  ? Ben gay or muscle rub as needed  ? Heat pad  Activity  Rest and return to your normal activities as told by your health care provider. Ask your health care provider what activities are safe for you.  Do exercises as told by your health care provider. General instructions  Do not use any products that contain nicotine or tobacco, such as cigarettes, e-cigarettes, and chewing tobacco. These can delay healing. If you need help quitting, ask your health care provider.  Ask your health care provider when it is safe for you to drive.  Take over-the-counter and prescription medicines only as told by your health care provider.  Keep all follow-up visits as told by your health care provider. This is important. How is this prevented?  Give your body time to rest between periods of activity.  Be safe and responsible while being active. This will help you avoid falls.  Maintain physical fitness, including strength and flexibility. Contact a health care provider if:  Your symptoms have not improved after 1-2 months of treatment and rest.  You cannot lift your arm away from your body. Summary  Shoulder impingement syndrome is a condition that causes pain when connective tissues (tendons) surrounding the shoulder joint become pinched.  The main symptom  of this condition is pain on the front or side of the shoulder.  This condition is usually treated with rest, ice, and pain medicines as needed. This information is not intended to replace advice given to you by your health care provider. Make sure you discuss any questions you have with your health care provider. Document Revised: 04/10/2019 Document Reviewed: 06/11/2018 Elsevier Patient Education  2020 [employer].

## 2020-10-31 NOTE — Progress Notes (Signed)
Chief Complaint  Patient presents with  . Shoulder Pain    right shoulder pain comes and goes. 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 Coronary atherosclerosis of native coronary artery   . Essential hypertension, benign   . History of gout   . Old myocardial infarction   . Other and unspecified hyperlipidemia    Past Surgical History:  Procedure Laterality Date  . CARDIAC CATHETERIZATION    . COLONOSCOPY N/A 07/08/2017   Procedure: COLONOSCOPY;  Surgeon: Corbin Ade, MD;  Location: [facility];  Service: Endoscopy;  Laterality: N/A;  2:15 pm  . KNEE ARTHROSCOPY WITH LATERAL MENISECTOMY Right 12/19/2017   Procedure: KNEE ARTHROSCOPY WITH LATERAL MENISECTOMY;  Surgeon: Vickki Hearing, MD;  Location: [facility];  Service: Orthopedics;  Laterality: Right;  . KNEE ARTHROSCOPY WITH MEDIAL MENISECTOMY Right 12/19/2017   Procedure: KNEE ARTHROSCOPY WITH MEDIAL MENISECTOMY;  Surgeon: Vickki Hearing, MD;  Location: [facility];  Service: Orthopedics;  Laterality: Right;  . POLYPECTOMY  07/08/2017   Procedure: POLYPECTOMY;  Surgeon: Corbin Ade, MD;  Location: [facility];  Service: Endoscopy;;  colon   BP (!) 174/88   Pulse 61   Ht 6\' 1"  (1.854 m)   Wt (!) 322 lb (146.1 kg)   BMI 42.48 kg/m   Appearance is normal other than BMI but he looks to be just a large muscular male The  patient meets the AMA guidelines for Morbid (severe) obesity with a BMI > 40.0 and I have recommended weight loss.  His pulses in his upper extremities were normal on each side with no swelling  He had no lymphadenopathy in the right side of his neck or axilla  His left shoulder had full range of motion was nontender and the right shoulder was tender over the anterior rotator interval.  He had normal range of motion except for internal rotation when compared right to left with a 6-7 interspace difference.  Shoulder stability was confirmed with abduction external rotation and inferior subluxation test muscle tone and strength were normal in abduction and empty can sign but he did have pain in the internally rotated flexed position skin was otherwise intact he had normal sensation he was oriented x3 mood and affect were normal  X-ray showed no arthritis  Encounter Diagnoses  Name Primary?  . Acute pain of right shoulder Yes  . Impingement syndrome of right shoulder      Procedure note the subacromial injection shoulder RIGHT    Verbal consent was obtained to inject the  RIGHT   Shoulder  Timeout was completed to confirm the injection site is a subacromial space of the  RIGHT  shoulder   Medication used Depo-Medrol 40 mg and lidocaine 1% 3 cc  Anesthesia was provided by ethyl chloride  The injection was performed in the RIGHT  posterior subacromial space. After pinning the skin with alcohol and  anesthetized the skin with ethyl chloride the subacromial space was injected using a 20-gauge needle. There were no complications  Sterile dressing was applied.   Follow-up as needed

## 2021-02-27 ENCOUNTER — Other Ambulatory Visit: Payer: Self-pay | Admitting: Cardiology

## 2021-06-08 ENCOUNTER — Other Ambulatory Visit: Payer: Self-pay | Admitting: Cardiology

## 2021-11-02 ENCOUNTER — Other Ambulatory Visit: Payer: Self-pay | Admitting: Cardiology

## 2021-11-17 ENCOUNTER — Ambulatory Visit: Payer: BC Managed Care – PPO | Admitting: Cardiology

## 2021-12-29 ENCOUNTER — Ambulatory Visit (INDEPENDENT_AMBULATORY_CARE_PROVIDER_SITE_OTHER): Payer: BC Managed Care – PPO | Admitting: Cardiology

## 2021-12-29 ENCOUNTER — Encounter: Payer: Self-pay | Admitting: Cardiology

## 2021-12-29 ENCOUNTER — Other Ambulatory Visit: Payer: Self-pay

## 2021-12-29 VITALS — BP 148/76 | HR 71 | Ht 73.0 in | Wt 305.4 lb

## 2021-12-29 DIAGNOSIS — I10 Essential (primary) hypertension: Secondary | ICD-10-CM

## 2021-12-29 DIAGNOSIS — E782 Mixed hyperlipidemia: Secondary | ICD-10-CM | POA: Diagnosis not present

## 2021-12-29 DIAGNOSIS — Z139 Encounter for screening, unspecified: Secondary | ICD-10-CM

## 2021-12-29 DIAGNOSIS — R7309 Other abnormal glucose: Secondary | ICD-10-CM

## 2021-12-29 DIAGNOSIS — I2510 Atherosclerotic heart disease of native coronary artery without angina pectoris: Secondary | ICD-10-CM

## 2021-12-29 DIAGNOSIS — R5383 Other fatigue: Secondary | ICD-10-CM

## 2021-12-29 MED ORDER — AMLODIPINE BESYLATE 5 MG PO TABS: 5.0000 mg | ORAL_TABLET | Freq: Every day | ORAL | 6 refills | Status: DC

## 2021-12-29 NOTE — Patient Instructions (Signed)
Medication Instructions:  Begin Norvasc 5mg  daily  Continue all other medications.     Labwork: PSA, BMET, CBC, TSH, FLP, HgA1c, Mg - orders given today Office will contact with results via phone or letter.     Testing/Procedures: none  Follow-Up: 6 months   Any Other Special Instructions Will Be Listed Below (If Applicable).   If you need a refill on your cardiac medications before your next appointment, please call your pharmacy.

## 2021-12-29 NOTE — Progress Notes (Signed)
Clinical Summary Tony Cannon is a 65 y.o.male seen today for follow up of the following medical problems.    1. CAD - noted multivessel disease in 2002, due to anatomy of lesions according to notes multivessel PCI was thought best intervention.   -03/2001 stent to LAD and LCX, 05/2001 stent to RCA. Cath with normal LVEF at that time.   - completed echo which showed LVEF 55-60%, grade I diastolic dysfunction  - 10/2015 exercise MPI showed inferior scar with mild peri-infarct ischemia, LVEF 47%. Exercised 10 minutes with no EKG changes, Duke treadmill score of 10 consistent with low risk for major cardiac events.   - imdur caused headaches, it was stopped       - no chest pains. No SOB/DOE - compliant with meds     2. Hyperlipidemia - had a rash with lipitor, has tolerated mild statin.  09/2020 TC TC 251 HDL 36 TG 210 LDL 178. He was on pravastatin at the time, changed to crestor 20mg  daily. - has not had recent panel since then, in process of changign pcp's     3. HTN - he is compliant with meds      4. OSA screen - he reports negative study 10 years - +snoring, occasional apneic episodes, no daytime somnolence.      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   Past Medical History:  Diagnosis Date   Coronary atherosclerosis of native coronary artery    Essential hypertension, benign    History of gout    Old myocardial infarction    Other and unspecified hyperlipidemia      No Known Allergies   Current Outpatient Medications  Medication Sig Dispense Refill   aspirin EC 81 MG tablet Take 81 mg by mouth daily.     calcium carbonate (TUMS EX) 750 MG chewable tablet Chew 1 tablet by mouth as needed for heartburn.     ibuprofen (ADVIL,MOTRIN) 800 MG tablet Take 1 tablet (800 mg total) by mouth every 8 (eight) hours as needed. 90 tablet 1   lisinopril (ZESTRIL) 40 MG tablet Take 1 tablet by mouth once  daily 90 tablet 1   metoprolol succinate (TOPROL-XL) 25 MG 24 hr tablet Take 1 tablet by mouth once daily 90 tablet 3   Multiple Vitamin (MULTIVITAMIN) capsule Take 1 capsule by mouth daily.     nitroGLYCERIN (NITROSTAT) 0.4 MG SL tablet Place 0.4 mg under the tongue every 5 (five) minutes as needed for chest pain.     rosuvastatin (CRESTOR) 20 MG tablet Take 1 tablet by mouth once daily 90 tablet 1   No current facility-administered medications for this visit.     Past Surgical History:  Procedure Laterality Date   CARDIAC CATHETERIZATION     COLONOSCOPY N/A 07/08/2017   Procedure: COLONOSCOPY;  Surgeon: 09/08/2017, MD;  Location: [facility];  Service: Endoscopy;  Laterality: N/A;  2:15 pm   KNEE ARTHROSCOPY WITH LATERAL MENISECTOMY Right 12/19/2017   Procedure: KNEE ARTHROSCOPY WITH LATERAL MENISECTOMY;  Surgeon: 12/21/2017, MD;  Location: [facility];  Service: Orthopedics;  Laterality: Right;   KNEE ARTHROSCOPY WITH MEDIAL MENISECTOMY Right 12/19/2017   Procedure: KNEE ARTHROSCOPY WITH MEDIAL MENISECTOMY;  Surgeon: 12/21/2017, MD;  Location: [facility];  Service: Orthopedics;  Laterality: Right;   POLYPECTOMY  07/08/2017   Procedure: POLYPECTOMY;  Surgeon: 09/08/2017, MD;  [facility]: [facility];  Service: Endoscopy;;  colon     No Known Allergies    Family History  Problem Relation Age of Onset   Severe sprains Neg Hx    Rheumatologic disease Neg Hx    Dislocations Neg Hx      Social History Tony Cannon reports that he quit smoking about 22 years ago. His smoking use included cigarettes. He has a 37.50 pack-year smoking history. He has never used smokeless tobacco. Tony Cannon reports current alcohol use of about 14.0 standard drinks per week.   Review of Systems CONSTITUTIONAL: No weight loss, fever, chills, weakness or fatigue.  HEENT: Eyes: No visual loss, blurred vision, double vision or yellow sclerae.No hearing loss, sneezing, congestion, runny  nose or sore throat.  SKIN: No rash or itching.  CARDIOVASCULAR: per hpi RESPIRATORY: No shortness of breath, cough or sputum.  GASTROINTESTINAL: No anorexia, nausea, vomiting or diarrhea. No abdominal pain or blood.  GENITOURINARY: No burning on urination, no polyuria NEUROLOGICAL: No headache, dizziness, syncope, paralysis, ataxia, numbness or tingling in the extremities. No change in bowel or bladder control.  MUSCULOSKELETAL: No muscle, back pain, joint pain or stiffness.  LYMPHATICS: No enlarged nodes. No history of splenectomy.  PSYCHIATRIC: No history of depression or anxiety.  ENDOCRINOLOGIC: No reports of sweating, cold or heat intolerance. No polyuria or polydipsia.  [name]   Physical Examination Today's Vitals   12/29/21 1056  BP: (!) 148/76  Pulse: 71  SpO2: 93%  Weight: (!) 305 lb 6.4 oz (138.5 kg)  Height: 6\' 1"  (1.854 m)   Body mass index is 40.29 kg/m.  Gen: resting comfortably, no acute distress HEENT: no scleral icterus, pupils equal round and reactive, no palptable cervical adenopathy,  CV: RRR, no m/r/g no jvd Resp: Clear to auscultation bilaterally GI: abdomen is soft, non-tender, non-distended, normal bowel sounds, no hepatosplenomegaly MSK: extremities are warm, no edema.  Skin: warm, no rash Neuro:  no focal deficits Psych: appropriate affect   Diagnostic Studies  10/2015 echo Study Conclusions  - Left ventricle: The cavity size was normal. Wall thickness was   increased in a pattern of mild LVH. There was severe focal basal   hypertrophy of the septum. Systolic function was normal. The   estimated ejection fraction was in the range of 55% to 60%. There   is hypokinesis of the basal-midinferolateral and inferior   myocardium. Doppler parameters are consistent with abnormal left   ventricular relaxation (grade 1 diastolic dysfunction). - Right atrium: Central venous pressure (est): 3 mm Hg. - Atrial septum: No defect or patent foramen ovale was  identified. - Tricuspid valve: There was physiologic regurgitation. - Pulmonary arteries: Systolic pressure could not be accurately   estimated. - Pericardium, extracardiac: There was no pericardial effusion.  Impressions:  - Mild LVH with severe septal hypertrophy, LVEF 55-60%. Mid to   basal inferior/inferolateral hypokinesis. Grade 1 diastolic   dysfunction.   10/2015 Exercise MPI Blood pressure demonstrated a hypertensive response to exercise. There was no ST segment deviation noted during stress. Frequent PVC's noted. Defect 1: There is a medium defect of severe severity present in the basal inferoseptal, basal inferior, basal inferolateral, mid inferior, mid inferolateral, apical inferior and apical lateral location. Findings consistent with prior myocardial infarction with peri-infarct ischemia. Specifically, there is myocardial scar with a mild degree of peri-infarct ischemia extending from the apex to the base and also involving the inferoseptal and inferolateral walls. This is an intermediate risk study. Nuclear stress EF: 47%.     Assessment  and Plan  1. CAD - no symptoms - EKG today shows SR, no ischemic changes - continue current meds   2. Hyperlipidemia - repeat labs, room to titrate crestor if needed   3. HTN - above goal, start norvasc 5mg  daily  F/u 6 months      , M.D.

## 2022-01-11 ENCOUNTER — Ambulatory Visit: Payer: BC Managed Care – PPO | Admitting: Cardiology

## 2022-01-11 LAB — HEMOGLOBIN A1C
Hgb A1c MFr Bld: 7.8 % of total Hgb — ABNORMAL HIGH (ref <5.7)
Mean Plasma Glucose: 177 mg/dL
eAG (mmol/L): 9.8 mmol/L

## 2022-01-11 LAB — LIPID PANEL
Cholesterol: 184 mg/dL (ref <200)
HDL: 42 mg/dL (ref >=40)
LDL Cholesterol (Calc): 114 mg/dL (calc) — ABNORMAL HIGH
Non-HDL Cholesterol (Calc): 142 mg/dL (calc) — ABNORMAL HIGH (ref <130)
Total CHOL/HDL Ratio: 4.4 (calc) (ref <5.0)
Triglycerides: 164 mg/dL — ABNORMAL HIGH (ref <150)

## 2022-01-11 LAB — PSA: PSA: 4.05 ng/mL — ABNORMAL HIGH (ref <=4.00)

## 2022-01-11 LAB — CBC
HCT: 41.4 % (ref 38.5–50.0)
Hemoglobin: 13.6 g/dL (ref 13.2–17.1)
MCH: 28.2 pg (ref 27.0–33.0)
MCHC: 32.9 g/dL (ref 32.0–36.0)
MCV: 85.7 fL (ref 80.0–100.0)
MPV: 10.2 fL (ref 7.5–12.5)
Platelets: 305 10*3/uL (ref 140–400)
RBC: 4.83 10*6/uL (ref 4.20–5.80)
RDW: 13.4 % (ref 11.0–15.0)
WBC: 6.2 10*3/uL (ref 3.8–10.8)

## 2022-01-11 LAB — BASIC METABOLIC PANEL WITH GFR
BUN: 11 mg/dL (ref 7–25)
CO2: 27 mmol/L (ref 20–32)
Calcium: 10 mg/dL (ref 8.6–10.3)
Chloride: 102 mmol/L (ref 98–110)
Creat: 0.82 mg/dL (ref 0.70–1.35)
Glucose, Bld: 141 mg/dL — ABNORMAL HIGH (ref 65–99)
Potassium: 4.2 mmol/L (ref 3.5–5.3)
Sodium: 138 mmol/L (ref 135–146)
eGFR: 99 mL/min/{1.73_m2} (ref >=60)

## 2022-01-11 LAB — MAGNESIUM: Magnesium: 1.8 mg/dL (ref 1.5–2.5)

## 2022-01-11 LAB — TSH: TSH: 1.37 mIU/L (ref 0.40–4.50)

## 2022-01-24 ENCOUNTER — Telehealth: Payer: Self-pay | Admitting: *Deleted

## 2022-01-24 MED ORDER — ROSUVASTATIN CALCIUM 40 MG PO TABS: 40.0000 mg | ORAL_TABLET | Freq: Every day | ORAL | 3 refills | Status: DC

## 2022-01-24 NOTE — Telephone Encounter (Signed)
-----   Message from Antoine Poche, MD sent at 01/24/2022 10:38 AM EST ----- Cholesterol too high, increase crestor to 40mg  daily. Very slightly elevated psa, please forward labs to pcp, patient will need to discuss if anything addiotnal is needed. BLood sugars also above goal which can be addressed by BrancH MD

## 2022-01-24 NOTE — Telephone Encounter (Signed)
Patient informed and verbalized understanding of plan. Copy sent to PCP 

## 2022-03-22 ENCOUNTER — Other Ambulatory Visit: Payer: Self-pay | Admitting: Cardiology

## 2022-05-14 ENCOUNTER — Ambulatory Visit (INDEPENDENT_AMBULATORY_CARE_PROVIDER_SITE_OTHER): Payer: BC Managed Care – PPO

## 2022-05-14 ENCOUNTER — Ambulatory Visit (INDEPENDENT_AMBULATORY_CARE_PROVIDER_SITE_OTHER): Payer: BC Managed Care – PPO | Admitting: Orthopedic Surgery

## 2022-05-14 VITALS — BP 139/74 | HR 76 | Ht 72.0 in | Wt 307.2 lb

## 2022-05-14 DIAGNOSIS — M7542 Impingement syndrome of left shoulder: Secondary | ICD-10-CM

## 2022-05-14 DIAGNOSIS — M25512 Pain in left shoulder: Secondary | ICD-10-CM

## 2022-05-14 MED ORDER — IBUPROFEN 800 MG PO TABS: 800.0000 mg | ORAL_TABLET | Freq: Three times a day (TID) | ORAL | 1 refills | Status: AC | PRN

## 2022-05-14 NOTE — Progress Notes (Signed)
Chief Complaint  ?Patient presents with  ? Shoulder Pain  ?  Left, no injury pain comes and goes, when it hurts it hurts to raise and go behind.   ? ? ?66 yo male with atraumatic onset of left shoulder pain denies any trauma.  Does have pain with forward elevation and reaching behind his back.  All the pain is anterior along the anterior joint line. ? ?Exam reveals mild weakness in abduction some pain in flexion positive click with forward elevation positive impingement sign at 150 degrees ? ?Positive Hawkins sign as well ? ?X-ray shows mild AC joint arthritis glenohumeral joint looks normal ? ?Impression ?Encounter Diagnoses  ?Name Primary?  ? Acute pain of left shoulder Yes  ? Impingement syndrome of left shoulder   ? ?Meds ordered this encounter  ?Medications  ? ibuprofen (ADVIL) 800 MG tablet  ?  Sig: Take 1 tablet (800 mg total) by mouth every 8 (eight) hours as needed.  ?  Dispense:  90 tablet  ?  Refill:  1  ? ?Procedure note the subacromial injection shoulder left  ? ?Verbal consent was obtained to inject the  Left   Shoulder ? ?Timeout was completed to confirm the injection site is a subacromial space of the  left  shoulder ? ?Medication used Depo-Medrol 40 mg and lidocaine 1% 3 cc ? ?Anesthesia was provided by ethyl chloride ? ?The injection was performed in the left  posterior subacromial space. After pinning the skin with alcohol and anesthetized the skin with ethyl chloride the subacromial space was injected using a 20-gauge needle. There were no complications ? ?Sterile dressing was applied. ? ?Fu 6-7 weeks  ? ? ? ? ?  ?

## 2022-05-14 NOTE — Patient Instructions (Signed)
You have received an injection of steroids into the joint. 15% of patients will have increased pain within the 24 hours postinjection.  ? ?This is transient and will go away.  ? ?We recommend that you use ice packs on the injection site for 20 minutes every 2 hours and extra strength Tylenol 2 tablets every 8 as needed until the pain resolves. ? ?If you continue to have pain after taking the Tylenol and using the ice please call the office for further instructions. ? ? ?Take ibuprofen 800 mg every 8 hrs as needed for pain  ?

## 2022-06-30 ENCOUNTER — Other Ambulatory Visit: Payer: Self-pay | Admitting: Cardiology

## 2022-07-05 ENCOUNTER — Encounter: Payer: Self-pay | Admitting: Orthopedic Surgery

## 2022-07-05 ENCOUNTER — Ambulatory Visit: Payer: BC Managed Care – PPO | Admitting: Orthopedic Surgery

## 2022-07-05 VITALS — Ht 72.0 in | Wt 308.0 lb

## 2022-07-05 DIAGNOSIS — M7542 Impingement syndrome of left shoulder: Secondary | ICD-10-CM

## 2022-07-05 DIAGNOSIS — M25512 Pain in left shoulder: Secondary | ICD-10-CM

## 2022-07-05 NOTE — Progress Notes (Signed)
FOLLOW UP   Encounter Diagnoses  Name Primary?   Acute pain of left shoulder Yes   Impingement syndrome of left shoulder      Chief Complaint  Patient presents with   Shoulder Pain    Lt shoulder doing better    -66 year old male status post injection left shoulder reports no pain at this time.  He says his shoulder has improved his range of motion is normal  I checked his strength it was normal as well  Recommend follow-up as needed

## 2022-07-12 ENCOUNTER — Ambulatory Visit: Payer: BC Managed Care – PPO | Admitting: Cardiology

## 2022-07-12 ENCOUNTER — Encounter: Payer: Self-pay | Admitting: Cardiology

## 2022-07-12 VITALS — BP 124/80 | HR 80 | Ht 73.0 in | Wt 309.0 lb

## 2022-07-12 DIAGNOSIS — E1165 Type 2 diabetes mellitus with hyperglycemia: Secondary | ICD-10-CM | POA: Diagnosis not present

## 2022-07-12 DIAGNOSIS — I2510 Atherosclerotic heart disease of native coronary artery without angina pectoris: Secondary | ICD-10-CM

## 2022-07-12 DIAGNOSIS — I10 Essential (primary) hypertension: Secondary | ICD-10-CM

## 2022-07-12 DIAGNOSIS — E782 Mixed hyperlipidemia: Secondary | ICD-10-CM

## 2022-07-12 MED ORDER — LOSARTAN POTASSIUM 100 MG PO TABS: 100.0000 mg | ORAL_TABLET | Freq: Every day | ORAL | 1 refills | Status: DC

## 2022-07-12 NOTE — Progress Notes (Signed)
Clinical Summary Tony Cannon is a 66 y.o.male seen today for follow up of the following medical problems.    1. CAD - noted multivessel disease in 2002, due to anatomy of lesions according to notes multivessel PCI was thought best intervention.   -03/2001 stent to LAD and LCX, 05/2001 stent to RCA. Cath with normal LVEF at that time.   - completed echo which showed LVEF 55-60%, grade I diastolic dysfunction   - 10/2015 exercise MPI showed inferior scar with mild peri-infarct ischemia, LVEF 47%. Exercised 10 minutes with no EKG changes, Duke treadmill score of 10 consistent with low risk for major cardiac events.    - imdur caused headaches, it was stopped       - no recent chest pains, no SOB/DOE - compliant with meds     2. Hyperlipidemia - had a rash with lipitor, has tolerated mild statin.  09/2020 TC TC 251 HDL 36 TG 210 LDL 178. He was on pravastatin at the time, changed to crestor 20mg  daily. - has not had recent panel since then, in process of changign pcp's   Jan 2023 TC 184 HDL 42 TG 164 LDL 114  - lipids were elevated, we increased crestor to 40mg  daily in Jan 2023 after results     3. HTN - he is compliant with meds  -chronc cough, he is on lisinopril.        4. OSA screen - he reports negative study 10 years - +snoring, occasional apneic episodes, no daytime somnolence.      SH: works as [employer], Feb 2023. Works 6 months on and 6 months off, modified retirement.   Past Medical History:  Diagnosis Date   Coronary atherosclerosis of native coronary artery    Essential hypertension, benign    History of gout    Old myocardial infarction    Other and unspecified hyperlipidemia      No Known Allergies   Current Outpatient Medications  Medication Sig Dispense Refill   amLODipine (NORVASC) 5 MG tablet Take 1 tablet (5 mg total) by mouth daily. 30 tablet 6   aspirin EC 81 MG tablet Take 81 mg by mouth daily.     calcium carbonate  (TUMS EX) 750 MG chewable tablet Chew 1 tablet by mouth as needed for heartburn.     ibuprofen (ADVIL) 800 MG tablet Take 1 tablet (800 mg total) by mouth every 8 (eight) hours as needed. 90 tablet 1   lisinopril (ZESTRIL) 40 MG tablet Take 1 tablet by mouth once daily 90 tablet 1   metoprolol succinate (TOPROL-XL) 25 MG 24 hr tablet Take 1 tablet by mouth once daily 90 tablet 2   Multiple Vitamin (MULTIVITAMIN) capsule Take 1 capsule by mouth daily.     naproxen sodium (ALEVE) 220 MG tablet Take 220 mg by mouth daily as needed.     nitroGLYCERIN (NITROSTAT) 0.4 MG SL tablet Place 0.4 mg under the tongue every 5 (five) minutes as needed for chest pain.     rosuvastatin (CRESTOR) 40 MG tablet Take 1 tablet (40 mg total) by mouth daily. 90 tablet 3   No current facility-administered medications for this visit.     Past Surgical History:  Procedure Laterality Date   CARDIAC CATHETERIZATION     COLONOSCOPY N/A 07/08/2017   Procedure: COLONOSCOPY;  Surgeon: Human resources officer, MD;  [facility]: [facility];  Service: Endoscopy;  Laterality: N/A;  2:15 pm   KNEE ARTHROSCOPY WITH LATERAL MENISECTOMY  Right 12/19/2017   Procedure: KNEE ARTHROSCOPY WITH LATERAL MENISECTOMY;  Surgeon: Vickki Hearing, MD;  Location: [facility];  Service: Orthopedics;  Laterality: Right;   KNEE ARTHROSCOPY WITH MEDIAL MENISECTOMY Right 12/19/2017   Procedure: KNEE ARTHROSCOPY WITH MEDIAL MENISECTOMY;  Surgeon: Vickki Hearing, MD;  Location: [facility];  Service: Orthopedics;  Laterality: Right;   POLYPECTOMY  07/08/2017   Procedure: POLYPECTOMY;  Surgeon: Corbin Ade, MD;  [facility];  Service: Endoscopy;;  colon     No Known Allergies    Family History  Problem Relation Age of Onset   Severe sprains Neg Hx    Rheumatologic disease Neg Hx    Dislocations Neg Hx      Social History Mr. Barb reports that he quit smoking about 22 years ago. His smoking use included cigarettes. He has a 37.50  pack-year smoking history. He has never used smokeless tobacco. Mr. Nauta reports current alcohol use of about 14.0 standard drinks of alcohol per week.   Review of Systems CONSTITUTIONAL: No weight loss, fever, chills, weakness or fatigue.  HEENT: Eyes: No visual loss, blurred vision, double vision or yellow sclerae.No hearing loss, sneezing, congestion, runny nose or sore throat.  SKIN: No rash or itching.  CARDIOVASCULAR: per hpi RESPIRATORY: No shortness of breath, cough or sputum.  GASTROINTESTINAL: No anorexia, nausea, vomiting or diarrhea. No abdominal pain or blood.  GENITOURINARY: No burning on urination, no polyuria NEUROLOGICAL: No headache, dizziness, syncope, paralysis, ataxia, numbness or tingling in the extremities. No change in bowel or bladder control.  MUSCULOSKELETAL: No muscle, back pain, joint pain or stiffness.  LYMPHATICS: No enlarged nodes. No history of splenectomy.  PSYCHIATRIC: No history of depression or anxiety.  ENDOCRINOLOGIC: No reports of sweating, cold or heat intolerance. No polyuria or polydipsia.  [name]   Physical Examination Today's Vitals   07/12/22 0950  BP: 124/80  Pulse: 80  SpO2: 96%  Weight: (!) 309 lb (140.2 kg)  Height: 6\' 1"  (1.854 m)   Body mass index is 40.77 kg/m.  Gen: resting comfortably, no acute distress HEENT: no scleral icterus, pupils equal round and reactive, no palptable cervical adenopathy,  CV: RRR, no m/r/g, no jvd Resp: Clear to auscultation bilaterally GI: abdomen is soft, non-tender, non-distended, normal bowel sounds, no hepatosplenomegaly MSK: extremities are warm, no edema.  Skin: warm, no rash Neuro:  no focal deficits Psych: appropriate affect   Diagnostic Studies  10/2015 echo Study Conclusions  - Left ventricle: The cavity size was normal. Wall thickness was   increased in a pattern of mild LVH. There was severe focal basal   hypertrophy of the septum. Systolic function was normal. The   estimated  ejection fraction was in the range of 55% to 60%. There   is hypokinesis of the basal-midinferolateral and inferior   myocardium. Doppler parameters are consistent with abnormal left   ventricular relaxation (grade 1 diastolic dysfunction). - Right atrium: Central venous pressure (est): 3 mm Hg. - Atrial septum: No defect or patent foramen ovale was identified. - Tricuspid valve: There was physiologic regurgitation. - Pulmonary arteries: Systolic pressure could not be accurately   estimated. - Pericardium, extracardiac: There was no pericardial effusion.  Impressions:  - Mild LVH with severe septal hypertrophy, LVEF 55-60%. Mid to   basal inferior/inferolateral hypokinesis. Grade 1 diastolic   dysfunction.   10/2015 Exercise MPI Blood pressure demonstrated a hypertensive response to exercise. There was no ST segment deviation noted during stress. Frequent PVC's noted. Defect  1: There is a medium defect of severe severity present in the basal inferoseptal, basal inferior, basal inferolateral, mid inferior, mid inferolateral, apical inferior and apical lateral location. Findings consistent with prior myocardial infarction with peri-infarct ischemia. Specifically, there is myocardial scar with a mild degree of peri-infarct ischemia extending from the apex to the base and also involving the inferoseptal and inferolateral walls. This is an intermediate risk study. Nuclear stress EF: 47%.   Assessment and Plan  1. CAD - no recent symptoms, continue current meds   2. Hyperlipidemia - repeat labs, contineu crestor 40mg  daily   3. HTN -at goal, continue current meds  4. DM2 - asked to discussed elevated HgbA1c with pcp. We will repeat test since we had done initialy lab 6 months ago.   Slighyl elevated PSA, asked to discused with pcp. We will repeat test since we had done initialy lab 6 months ago.       , M.D.

## 2022-07-12 NOTE — Patient Instructions (Signed)
Medication Instructions:  Your physician has recommended you make the following change in your medication:  Stop lisinopril Start losartan 100 mg once a day Continue all other medications as directed  Labwork: Labs @ [facility] HgA1C PSA  Testing/Procedures: None  Follow-Up:  Your physician recommends that you schedule a follow-up appointment in: 6 months  Any Other Special Instructions Will Be Listed Below (If Applicable).  If you need a refill on your cardiac medications before your next appointment, please call your pharmacy.

## 2022-09-06 ENCOUNTER — Other Ambulatory Visit: Payer: Self-pay | Admitting: Cardiology

## 2023-01-25 ENCOUNTER — Other Ambulatory Visit: Payer: Self-pay | Admitting: *Deleted

## 2023-01-25 ENCOUNTER — Telehealth: Payer: Self-pay | Admitting: Cardiology

## 2023-01-25 DIAGNOSIS — E782 Mixed hyperlipidemia: Secondary | ICD-10-CM

## 2023-01-25 DIAGNOSIS — R7309 Other abnormal glucose: Secondary | ICD-10-CM

## 2023-01-25 DIAGNOSIS — Z125 Encounter for screening for malignant neoplasm of prostate: Secondary | ICD-10-CM

## 2023-01-25 DIAGNOSIS — Z79899 Other long term (current) drug therapy: Secondary | ICD-10-CM

## 2023-01-25 DIAGNOSIS — I10 Essential (primary) hypertension: Secondary | ICD-10-CM

## 2023-01-25 NOTE — Telephone Encounter (Signed)
Lab orders printed - patient will pick up next week.

## 2023-01-25 NOTE — Telephone Encounter (Signed)
Patient calling to ask if he can come by the office to pick up his lab orders, because he lost them.

## 2023-01-28 ENCOUNTER — Ambulatory Visit: Payer: BC Managed Care – PPO | Admitting: Cardiology

## 2023-02-25 ENCOUNTER — Other Ambulatory Visit: Payer: Self-pay | Admitting: Cardiology

## 2023-02-27 ENCOUNTER — Other Ambulatory Visit: Payer: Self-pay | Admitting: Cardiology

## 2023-02-28 ENCOUNTER — Encounter: Payer: Self-pay | Admitting: Radiology

## 2023-04-26 ENCOUNTER — Other Ambulatory Visit: Payer: Self-pay | Admitting: Cardiology

## 2023-05-14 ENCOUNTER — Ambulatory Visit: Payer: BC Managed Care – PPO | Admitting: Cardiology

## 2023-06-11 ENCOUNTER — Other Ambulatory Visit: Payer: Self-pay | Admitting: Neurosurgery

## 2023-06-11 DIAGNOSIS — M5416 Radiculopathy, lumbar region: Secondary | ICD-10-CM

## 2023-06-12 ENCOUNTER — Other Ambulatory Visit: Payer: Self-pay | Admitting: Cardiology

## 2023-06-13 ENCOUNTER — Encounter: Payer: Self-pay | Admitting: Neurosurgery

## 2023-06-16 ENCOUNTER — Ambulatory Visit
Admission: RE | Admit: 2023-06-16 | Discharge: 2023-06-16 | Disposition: A | Discharge status: Home / Self Care | Payer: BC Managed Care – PPO | Source: Ambulatory Visit | Attending: Neurosurgery | Admitting: Neurosurgery

## 2023-06-16 DIAGNOSIS — M5416 Radiculopathy, lumbar region: Secondary | ICD-10-CM

## 2023-07-17 ENCOUNTER — Ambulatory Visit (HOSPITAL_COMMUNITY): Payer: BC Managed Care – PPO

## 2023-09-05 DIAGNOSIS — M9903 Segmental and somatic dysfunction of lumbar region: Secondary | ICD-10-CM | POA: Diagnosis not present

## 2023-09-05 DIAGNOSIS — M5136 Other intervertebral disc degeneration, lumbar region: Secondary | ICD-10-CM | POA: Diagnosis not present

## 2023-09-10 DIAGNOSIS — M5136 Other intervertebral disc degeneration, lumbar region: Secondary | ICD-10-CM | POA: Diagnosis not present

## 2023-09-10 DIAGNOSIS — M9903 Segmental and somatic dysfunction of lumbar region: Secondary | ICD-10-CM | POA: Diagnosis not present

## 2023-09-18 DIAGNOSIS — M9903 Segmental and somatic dysfunction of lumbar region: Secondary | ICD-10-CM | POA: Diagnosis not present

## 2023-09-18 DIAGNOSIS — M5136 Other intervertebral disc degeneration, lumbar region: Secondary | ICD-10-CM | POA: Diagnosis not present

## 2023-09-23 ENCOUNTER — Other Ambulatory Visit: Payer: Self-pay | Admitting: Cardiology

## 2023-09-30 DIAGNOSIS — R0981 Nasal congestion: Secondary | ICD-10-CM | POA: Diagnosis not present

## 2023-09-30 DIAGNOSIS — J069 Acute upper respiratory infection, unspecified: Secondary | ICD-10-CM | POA: Diagnosis not present

## 2023-10-03 DIAGNOSIS — Z809 Family history of malignant neoplasm, unspecified: Secondary | ICD-10-CM | POA: Diagnosis not present

## 2023-10-03 DIAGNOSIS — K219 Gastro-esophageal reflux disease without esophagitis: Secondary | ICD-10-CM | POA: Diagnosis not present

## 2023-10-03 DIAGNOSIS — E119 Type 2 diabetes mellitus without complications: Secondary | ICD-10-CM | POA: Diagnosis not present

## 2023-10-03 DIAGNOSIS — Z794 Long term (current) use of insulin: Secondary | ICD-10-CM | POA: Diagnosis not present

## 2023-10-03 DIAGNOSIS — I10 Essential (primary) hypertension: Secondary | ICD-10-CM | POA: Diagnosis not present

## 2023-10-03 DIAGNOSIS — Z8249 Family history of ischemic heart disease and other diseases of the circulatory system: Secondary | ICD-10-CM | POA: Diagnosis not present

## 2023-10-03 DIAGNOSIS — I252 Old myocardial infarction: Secondary | ICD-10-CM | POA: Diagnosis not present

## 2023-10-03 DIAGNOSIS — M170 Bilateral primary osteoarthritis of knee: Secondary | ICD-10-CM | POA: Diagnosis not present

## 2023-10-03 DIAGNOSIS — M479 Spondylosis, unspecified: Secondary | ICD-10-CM | POA: Diagnosis not present

## 2023-10-03 DIAGNOSIS — E785 Hyperlipidemia, unspecified: Secondary | ICD-10-CM | POA: Diagnosis not present

## 2023-10-03 DIAGNOSIS — Z87891 Personal history of nicotine dependence: Secondary | ICD-10-CM | POA: Diagnosis not present

## 2023-10-09 DIAGNOSIS — M51362 Other intervertebral disc degeneration, lumbar region with discogenic back pain and lower extremity pain: Secondary | ICD-10-CM | POA: Diagnosis not present

## 2023-10-09 DIAGNOSIS — M9903 Segmental and somatic dysfunction of lumbar region: Secondary | ICD-10-CM | POA: Diagnosis not present

## 2023-10-18 ENCOUNTER — Other Ambulatory Visit: Payer: Self-pay

## 2023-10-18 ENCOUNTER — Encounter (HOSPITAL_COMMUNITY): Payer: Self-pay | Admitting: *Deleted

## 2023-10-18 ENCOUNTER — Emergency Department (HOSPITAL_COMMUNITY): Payer: Medicare HMO

## 2023-10-18 ENCOUNTER — Emergency Department (HOSPITAL_COMMUNITY)
Admission: EM | Admit: 2023-10-18 | Discharge: 2023-10-18 | Disposition: A | Discharge status: Home / Self Care | Payer: Medicare HMO | Attending: Emergency Medicine | Admitting: Emergency Medicine

## 2023-10-18 DIAGNOSIS — I10 Essential (primary) hypertension: Secondary | ICD-10-CM | POA: Diagnosis not present

## 2023-10-18 DIAGNOSIS — M25562 Pain in left knee: Secondary | ICD-10-CM | POA: Diagnosis not present

## 2023-10-18 DIAGNOSIS — S80812A Abrasion, left lower leg, initial encounter: Secondary | ICD-10-CM | POA: Diagnosis not present

## 2023-10-18 DIAGNOSIS — S80912A Unspecified superficial injury of left knee, initial encounter: Secondary | ICD-10-CM | POA: Diagnosis not present

## 2023-10-18 DIAGNOSIS — Z79899 Other long term (current) drug therapy: Secondary | ICD-10-CM | POA: Insufficient documentation

## 2023-10-18 DIAGNOSIS — E119 Type 2 diabetes mellitus without complications: Secondary | ICD-10-CM | POA: Diagnosis not present

## 2023-10-18 DIAGNOSIS — Z7982 Long term (current) use of aspirin: Secondary | ICD-10-CM | POA: Insufficient documentation

## 2023-10-18 DIAGNOSIS — M799 Soft tissue disorder, unspecified: Secondary | ICD-10-CM | POA: Diagnosis not present

## 2023-10-18 HISTORY — DX: Type 2 diabetes mellitus without complications: E11.9

## 2023-10-18 MED ORDER — KETOROLAC TROMETHAMINE 10 MG PO TABS
10.0000 mg | ORAL_TABLET | Freq: Once | ORAL | Status: AC
  Administered 2023-10-18: 10 mg via ORAL
  Filled 2023-10-18: qty 1

## 2023-10-18 MED ORDER — NAPROXEN 500 MG PO TABS: 500.0000 mg | ORAL_TABLET | Freq: Two times a day (BID) | ORAL | 0 refills | Status: AC

## 2023-10-18 NOTE — ED Provider Notes (Signed)
[name] Provider Note   CSN: [id number] Arrival date & time: 10/18/23  1212     History  Chief Complaint  Patient presents with   Knee Pain    Tony Cannon is a 67 y.o. male.  He has PMH of hypertension, high cholesterol and reports history of diabetes.  Presents to the ER today for left knee pain x 2 days denies trauma or injury, has never had pain is before he locates the pain over the proximal tibia.  Denies any overuse or increased activity prior to symptoms increasing.  His only attempt at relieving the pain was taking Tylenol last night which she states was minimally effective.  He denies fever or chills.  He is able to ambulate but states he has crutches at home if he needs them.  Denies calf swelling or tenderness.  No numbness or tingling.  No weakness.   Knee Pain      Home Medications Prior to Admission medications   Medication Sig Start Date End Date Taking? Authorizing Provider  naproxen (NAPROSYN) 500 MG tablet Take 1 tablet (500 mg total) by mouth 2 (two) times daily. 10/18/23  Yes Glennice Marcos A, PA-C  amLODipine (NORVASC) 5 MG tablet Take 1 tablet by mouth once daily 09/06/22   Antoine Poche, MD  aspirin EC 81 MG tablet Take 81 mg by mouth daily.    [provider]  calcium carbonate (TUMS EX) 750 MG chewable tablet Chew 1 tablet by mouth as needed for heartburn.    [provider]  ibuprofen (ADVIL) 800 MG tablet Take 1 tablet (800 mg total) by mouth every 8 (eight) hours as needed. 05/14/22   Vickki Hearing, MD  losartan (COZAAR) 100 MG tablet Take 1 tablet by mouth once daily 09/23/23   Antoine Poche, MD  metoprolol succinate (TOPROL-XL) 25 MG 24 hr tablet Take 1 tablet by mouth once daily 09/23/23   Antoine Poche, MD  Multiple Vitamin (MULTIVITAMIN) capsule Take 1 capsule by mouth daily.    [provider]  nitroGLYCERIN (NITROSTAT) 0.4 MG SL tablet Place 0.4 mg  under the tongue every 5 (five) minutes as needed for chest pain.    [provider]  rosuvastatin (CRESTOR) 40 MG tablet Take 1 tablet by mouth once daily 04/26/23   Antoine Poche, MD      Allergies    Patient has no known allergies.    Review of Systems   Review of Systems  Physical Exam Updated Vital Signs BP 135/81 (BP Location: Right Arm)   Pulse 95   Temp 97.7 F (36.5 C) (Oral)   Resp 16   Ht 6' (1.829 m)   Wt 131.5 kg   SpO2 100%   BMI 39.33 kg/m  Physical Exam Vitals and nursing note reviewed.  Constitutional:      General: He is not in acute distress.    Appearance: He is well-developed.  HENT:     Head: Normocephalic and atraumatic.  Eyes:     Conjunctiva/sclera: Conjunctivae normal.  Cardiovascular:     Rate and Rhythm: Normal rate and regular rhythm.     Heart sounds: No murmur heard. Pulmonary:     Effort: Pulmonary effort is normal. No respiratory distress.     Breath sounds: Normal breath sounds.  Abdominal:     Palpations: Abdomen is soft.     Tenderness: There is no abdominal tenderness.  Musculoskeletal:  General: No swelling.     Cervical back: Neck supple.  Skin:    General: Skin is warm and dry.     Capillary Refill: Capillary refill takes less than 2 seconds.  Neurological:     Mental Status: He is alert.  Psychiatric:        Mood and Affect: Mood normal.     ED Results / Procedures / Treatments   Labs (all labs ordered are listed, but only abnormal results are displayed) Labs Reviewed - No data to display  EKG None  Radiology DG Knee Complete 4 Views Left  Result Date: 10/18/2023 CLINICAL DATA:  Left knee pain.  No known injury. EXAM: LEFT KNEE - COMPLETE 4+ VIEW COMPARISON:  None Available. FINDINGS: No evidence of fracture, dislocation, or joint effusion. The joint spaces are preserved. Trace peripheral spurring. Chronic fragmentation of the anterior tibial tubercle. Slight prepatellar soft tissue  thickening. IMPRESSION: 1. Slight prepatellar soft tissue thickening, may represent bursitis. 2. No acute osseous abnormalities.  Trace peripheral spurring. Electronically Signed   By: Narda Rutherford M.D.   On: 10/18/2023 12:55    Procedures Procedures    Medications Ordered in ED Medications  ketorolac (TORADOL) tablet 10 mg (has no administration in time range)    ED Course/ Medical Decision Making/ A&P                                 Medical Decision Making This patient presents to the ED for concern of left knee pain x 2 days, this involves an extensive number of treatment options, and is a complaint that carries with it a high risk of complications and morbidity.  The differential diagnosis includes fracture, contusion, sprain, bursitis, cellulitis, septic arthritis, osteoarthritis, inflammatory arthritis, other   Co morbidities that complicate the patient evaluation  Hypertension, diabetes   Additional history obtained:  Additional history obtained from EMR External records from outside source obtained and reviewed including notes, labs   Imaging Studies ordered:  I ordered imaging studies including x-ray left knee I independently visualized and interpreted imaging which showed no acute fracture or dislocation within the scope of determining acute emergent findings I agree with the radiologist interpretation-chronic spurring noted and mild prepatellar soft tissue thickening suggesting possible bursitis     Problem List / ED Course / Critical interventions / Medication management  Knee pain-patient has mild tenderness and swelling over prepatellar and proximal tibia.  On exam no redness or warmth, he can fully flex and extend without difficulty.  Distal pulses intact.  No joint effusion noted.  No systemic symptoms such as fever chills or redness or significant warmth to the joint.  Do not feel this represents cellulitis or septic arthritis.  Likely inflammatory  processes bursitis given x-rays.  Advised on over-the-counter Lidoderm patches which should be helpful and will start on NSAIDs, advised on rest and close follow-up, given strict return precautions. I ordered medication including naproxen  for pain  I have reviewed the patients home medicines and have made adjustments as needed   Amount and/or Complexity of Data Reviewed Radiology: ordered.           Final Clinical Impression(s) / ED Diagnoses Final diagnoses:  Acute pain of left knee    Rx / DC Orders ED Discharge Orders          Ordered    naproxen (NAPROSYN) 500 MG tablet  2 times daily  10/18/23 1333              Ma Rings, PA-C 10/18/23 1338    Vanetta Mulders, MD 10/19/23 1022

## 2023-10-18 NOTE — ED Provider Triage Note (Signed)
Emergency Medicine Provider Triage Evaluation Note  Temarion Hanlin Heidler , a 67 y.o. male  was evaluated in triage.  Pt complains of left knee pain and swelling.  Review of Systems  Positive: Left knee pain and swelling Negative: Fever  Physical Exam  BP 135/81 (BP Location: Right Arm)   Pulse 95   Temp 97.7 F (36.5 C) (Oral)   Resp 16   Ht 6' (1.829 m)   Wt 131.5 kg   SpO2 100%   BMI 39.33 kg/m  Gen:   Awake, no distress   Resp:  Normal effort  MSK:   Difficulty with left knee full extension and flexion secondary to pain . moves other extremities without difficulty  Other:    Medical Decision Making  Medically screening exam initiated at 12:51 PM.  Appropriate orders placed.  Khali Westfahl Pharo was informed that the remainder of the evaluation will be completed by another provider, this initial triage assessment does not replace that evaluation, and the importance of remaining in the ED until their evaluation is complete.     Judithann Sheen, PA 10/18/23 1252

## 2023-10-18 NOTE — Discharge Instructions (Addendum)
It was a pleasure taking care of you today.  You were seen for left knee pain for the past couple of days.  Your x-rays do show a little bit of soft tissue thickening on the front of the knee suggestive that this could be bursitis which be consistent with your pain.  We are going to treat with anti of inflammatories.  You can also use the brace as needed and the crutches you have at home if needed.  Try to rest your knee and avoid overexertion.   Follow-up with your primary care doctor, if not improving follow-up with orthopedics.    If you have new worsening symptoms , especially redness, swelling, fever or severe pain you to come back to the ER right away.

## 2023-10-18 NOTE — ED Triage Notes (Signed)
Pt with left knee pain since Wednesday night.  + swelling.  Denies any fevers. Denies any injury to knee.

## 2023-10-23 ENCOUNTER — Encounter: Payer: Self-pay | Admitting: Orthopedic Surgery

## 2023-10-23 ENCOUNTER — Ambulatory Visit (INDEPENDENT_AMBULATORY_CARE_PROVIDER_SITE_OTHER): Payer: Medicare HMO | Admitting: Orthopedic Surgery

## 2023-10-23 VITALS — BP 132/88 | HR 98 | Ht 72.0 in | Wt 291.0 lb

## 2023-10-23 DIAGNOSIS — M7042 Prepatellar bursitis, left knee: Secondary | ICD-10-CM

## 2023-10-23 MED ORDER — PREDNISONE 10 MG (21) PO TBPK: ORAL_TABLET | ORAL | 0 refills | Status: DC

## 2023-10-23 NOTE — Progress Notes (Signed)
New Patient Visit  Assessment: Tony Cannon is a 67 y.o. male with the following: 1. Prepatellar bursitis of left knee  Plan: Armstead Arguello Grigorian has pain in the anterior aspect of the left knee.  No specific injury.  He does not work on his knees.  Presentation is most consistent with some prepatellar bursitis.  Not concerned about an infection at this time.  This presentation is not consistent with gout.  I will provide him with a short course of prednisone.  He can continue with ice.  He can also use some topical treatments.  If he continues to have issues, I am happy to see him back in clinic.  Otherwise, follow-up as needed.  Follow-up: No follow-ups on file.  Subjective:  Chief Complaint  Patient presents with   Knee Pain    L knee seen in ED for pain for 1 wk. No injuries pain just intensified in a couple days.    History of Present Illness: Tony Cannon is a 67 y.o. male who presents for evaluation of left anterior knee pain.  Approximately week ago, he had acute onset pain in the anterior aspect of the left knee.  He states it really intensifies for a couple of days.  No specific injury.  He does not work on his knees.  He noticed some swelling over the front of his knee.  He does have a history of gout in his right toe.  He is never had gout in his left knee.  He was evaluated in the emergency department.  He was provided naproxen, but he does not think this is helping.  He states the swelling in tenderness has improved, but not resolved.  He has been icing his left knee.   Review of Systems: No fevers or chills No numbness or tingling No chest pain No shortness of breath No bowel or bladder dysfunction No GI distress No headaches   Medical History:  Past Medical History:  Diagnosis Date   Coronary atherosclerosis of native coronary artery    Diabetes mellitus without complication (HCC)    Essential hypertension, benign    History of gout    Old myocardial  infarction    Other and unspecified hyperlipidemia     Past Surgical History:  Procedure Laterality Date   CARDIAC CATHETERIZATION     COLONOSCOPY N/A 07/08/2017   Procedure: COLONOSCOPY;  Surgeon: Corbin Ade, MD;  [facility];  Service: Endoscopy;  Laterality: N/A;  2:15 pm   KNEE ARTHROSCOPY WITH LATERAL MENISECTOMY Right 12/19/2017   Procedure: KNEE ARTHROSCOPY WITH LATERAL MENISECTOMY;  Surgeon: Vickki Hearing, MD;  Location: [facility];  Service: Orthopedics;  Laterality: Right;   KNEE ARTHROSCOPY WITH MEDIAL MENISECTOMY Right 12/19/2017   Procedure: KNEE ARTHROSCOPY WITH MEDIAL MENISECTOMY;  Surgeon: Vickki Hearing, MD;  Location: [facility];  Service: Orthopedics;  Laterality: Right;   POLYPECTOMY  07/08/2017   Procedure: POLYPECTOMY;  Surgeon: Corbin Ade, MD;  [facility];  Service: Endoscopy;;  colon    Family History  Problem Relation Age of Onset   Severe sprains Neg Hx    Rheumatologic disease Neg Hx    Dislocations Neg Hx    Social History   Tobacco Use   Smoking status: Former    Current packs/day: 0.00    Average packs/day: 1.5 packs/day for 25.0 years (37.5 ttl pk-yrs)    Types: Cigarettes    Start date: 08/19/1974    Quit date: 08/20/1999  Years since quitting: 24.1   Smokeless tobacco: Never  Vaping Use   Vaping status: Never Used  Substance Use Topics   Alcohol use: Yes    Alcohol/week: 14.0 standard drinks of alcohol    Types: 14 Cans of beer per week   Drug use: No    No Known Allergies  Current Meds  Medication Sig   predniSONE (STERAPRED UNI-PAK 21 TAB) 10 MG (21) TBPK tablet 10 mg DS 12 as directed    Objective: BP 132/88   Pulse 98   Ht 6' (1.829 m)   Wt 291 lb (132 kg)   BMI 39.47 kg/m   Physical Exam:  General: Alert and oriented. and No acute distress. Gait: Left sided antalgic gait.  Evaluation of the left knee demonstrates mild effusion over the anterior knee.  There is some tenderness to  palpation overlying the tibial tubercle.  There is some inflammation in this area, as it is warm.  However, there is no fluctuance.  No fluid collection.  No tenderness palpation along the medial lateral joint line.  Negative Lachman.  No increased laxity varus or valgus stress.  IMAGING: I personally reviewed images previously obtained from the ED  X-rays were obtained in the emergency department.  No acute injuries.  Mild to moderate degenerative changes overall.  There is some soft tissue swelling in the anterior aspect of the knee.   New Medications:  Meds ordered this encounter  Medications   predniSONE (STERAPRED UNI-PAK 21 TAB) 10 MG (21) TBPK tablet    Sig: 10 mg DS 12 as directed    Dispense:  48 tablet    Refill:  0      Oliver Barre, MD  10/23/2023 2:41 PM

## 2023-10-24 DIAGNOSIS — M9903 Segmental and somatic dysfunction of lumbar region: Secondary | ICD-10-CM | POA: Diagnosis not present

## 2023-10-24 DIAGNOSIS — M51362 Other intervertebral disc degeneration, lumbar region with discogenic back pain and lower extremity pain: Secondary | ICD-10-CM | POA: Diagnosis not present

## 2023-10-28 ENCOUNTER — Other Ambulatory Visit: Payer: Self-pay | Admitting: Cardiology

## 2023-10-30 DIAGNOSIS — M9903 Segmental and somatic dysfunction of lumbar region: Secondary | ICD-10-CM | POA: Diagnosis not present

## 2023-10-30 DIAGNOSIS — M51362 Other intervertebral disc degeneration, lumbar region with discogenic back pain and lower extremity pain: Secondary | ICD-10-CM | POA: Diagnosis not present

## 2023-11-11 DIAGNOSIS — M51362 Other intervertebral disc degeneration, lumbar region with discogenic back pain and lower extremity pain: Secondary | ICD-10-CM | POA: Diagnosis not present

## 2023-11-11 DIAGNOSIS — M9903 Segmental and somatic dysfunction of lumbar region: Secondary | ICD-10-CM | POA: Diagnosis not present

## 2023-11-13 ENCOUNTER — Other Ambulatory Visit: Payer: Self-pay | Admitting: Cardiology

## 2023-11-19 ENCOUNTER — Other Ambulatory Visit: Payer: Self-pay | Admitting: Cardiology

## 2023-11-20 DIAGNOSIS — M51362 Other intervertebral disc degeneration, lumbar region with discogenic back pain and lower extremity pain: Secondary | ICD-10-CM | POA: Diagnosis not present

## 2023-11-20 DIAGNOSIS — M9903 Segmental and somatic dysfunction of lumbar region: Secondary | ICD-10-CM | POA: Diagnosis not present

## 2023-11-26 ENCOUNTER — Other Ambulatory Visit: Payer: Self-pay | Admitting: Cardiology

## 2023-12-02 DIAGNOSIS — M51362 Other intervertebral disc degeneration, lumbar region with discogenic back pain and lower extremity pain: Secondary | ICD-10-CM | POA: Diagnosis not present

## 2023-12-02 DIAGNOSIS — M9903 Segmental and somatic dysfunction of lumbar region: Secondary | ICD-10-CM | POA: Diagnosis not present

## 2023-12-12 ENCOUNTER — Other Ambulatory Visit: Payer: Self-pay | Admitting: Cardiology

## 2023-12-16 ENCOUNTER — Telehealth: Payer: Self-pay | Admitting: Cardiology

## 2023-12-16 MED ORDER — METOPROLOL SUCCINATE ER 25 MG PO TB24: 25.0000 mg | ORAL_TABLET | Freq: Every day | ORAL | 0 refills | Status: DC

## 2023-12-16 MED ORDER — LOSARTAN POTASSIUM 100 MG PO TABS: 100.0000 mg | ORAL_TABLET | Freq: Every day | ORAL | 0 refills | Status: DC

## 2023-12-16 NOTE — Telephone Encounter (Signed)
*  STAT* If patient is at the pharmacy, call can be transferred to refill team.   1. Which medications need to be refilled? (please list name of each medication and dose if known)   metoprolol succinate (TOPROL-XL) 25 MG 24 hr tablet  losartan (COZAAR) 100 MG tablet   2. Would you like to learn more about the convenience, safety, & potential cost savings by using the [facility]?   3. Are you open to using the [facility] (Type [facility]. ).  4. Which pharmacy/location (including street and city if local pharmacy) is medication to be sent to?  [facility] [address], [place] - [address]   5. Do they need a 30 day or 90 day supply?   90 day  Patient stated he still has some medication.  Patient has appointment scheduled on 01/28/24.

## 2023-12-16 NOTE — Telephone Encounter (Signed)
Filled until appointment 

## 2023-12-18 ENCOUNTER — Other Ambulatory Visit: Payer: Self-pay | Admitting: Cardiology

## 2024-01-10 ENCOUNTER — Other Ambulatory Visit: Payer: Self-pay | Admitting: Cardiology

## 2024-01-28 ENCOUNTER — Other Ambulatory Visit: Payer: Self-pay | Admitting: Cardiology

## 2024-01-28 ENCOUNTER — Ambulatory Visit: Payer: BC Managed Care – PPO | Admitting: Nurse Practitioner

## 2024-02-03 ENCOUNTER — Other Ambulatory Visit: Payer: Self-pay | Admitting: Cardiology

## 2024-02-05 ENCOUNTER — Ambulatory Visit: Payer: Medicare PPO | Admitting: Student

## 2024-02-05 NOTE — Progress Notes (Deleted)
 Cardiology Office Note    Date:  02/05/2024  ID:  Tony Cannon, DOB 1956/03/31, MRN [id number] Cardiologist: Alvan Carrier, MD    History of Present Illness:    Tony Cannon is a 68 y.o. male with past medical history of CAD (stenting to LAD and LCx in 03/2001 and stent to the RCA in 05/2001, NST in 10/2015 showing mild peri-infarct ischemia and medical management pursued), HTN and HLD who presents to the office today for overdue follow-up.  He was last examined by Dr. Alvan in 06/2022 and denied any recent anginal symptoms at that time.  He had previously been on Imdur  but this was discontinued in the interim given headaches.  No changes were made to his cardiac medications at that time.  - Labs from PCP (LDL 144 in 2024) - adjust statin?  ROS: ***  Studies Reviewed:   EKG: EKG is*** ordered today and demonstrates ***   EKG Interpretation Date/Time:    Ventricular Rate:    PR Interval:    QRS Duration:    QT Interval:    QTC Calculation:   R Axis:      Text Interpretation:         Echocardiogram: 10/2015 Study Conclusions   - Left ventricle: The cavity size was normal. Wall thickness was    increased in a pattern of mild LVH. There was severe focal basal    hypertrophy of the septum. Systolic function was normal. The    estimated ejection fraction was in the range of 55% to 60%. There    is hypokinesis of the basal-midinferolateral and inferior    myocardium. Doppler parameters are consistent with abnormal left    ventricular relaxation (grade 1 diastolic dysfunction).  - Right atrium: Central venous pressure (est): 3 mm Hg.  - Atrial septum: No defect or patent foramen ovale was identified.  - Tricuspid valve: There was physiologic regurgitation.  - Pulmonary arteries: Systolic pressure could not be accurately    estimated.  - Pericardium, extracardiac: There was no pericardial effusion.   Impressions:   - Mild LVH with severe septal hypertrophy,  LVEF 55-60%. Mid to    basal inferior/inferolateral hypokinesis. Grade 1 diastolic    dysfunction.    NST: 10/2015 Blood pressure demonstrated a hypertensive response to exercise. There was no ST segment deviation noted during stress. Frequent PVC's noted. Defect 1: There is a medium defect of severe severity present in the basal inferoseptal, basal inferior, basal inferolateral, mid inferior, mid inferolateral, apical inferior and apical lateral location. Findings consistent with prior myocardial infarction with peri-infarct ischemia. Specifically, there is myocardial scar with a mild degree of peri-infarct ischemia extending from the apex to the base and also involving the inferoseptal and inferolateral walls. This is an intermediate risk study. Nuclear stress EF: 47%.  Risk Assessment/Calculations:   {Does this patient have ATRIAL FIBRILLATION?:[id number]} No BP recorded.  {Refresh Note OR Click here to enter BP  :1}***         Physical Exam:   VS:  There were no vitals taken for this visit.   Wt Readings from Last 3 Encounters:  10/23/23 291 lb (132 kg)  10/18/23 290 lb (131.5 kg)  07/12/22 (!) 309 lb (140.2 kg)     GEN: Well nourished, well developed in no acute distress NECK: No JVD; No carotid bruits CARDIAC: ***RRR, no murmurs, rubs, gallops RESPIRATORY:  Clear to auscultation without rales, wheezing or rhonchi  ABDOMEN: Appears non-distended. No obvious abdominal masses. EXTREMITIES:  No clubbing or cyanosis. No edema.  Distal pedal pulses are 2+ bilaterally.   Assessment and Plan:   1. CAD - He underwent multivessel PCI in 2002 with details as discussed above and most recent ischemic evaluation was an NST in 2016 which showed evidence of prior infarction with mild peri-infarct ischemia and was an intermediate risk study.  2. HTN - ***  3. HLD - His LDL was at 144 when checked by his PCP last year.   Signed, Laymon CHRISTELLA Qua, PA-C

## 2024-02-14 ENCOUNTER — Encounter: Payer: Self-pay | Admitting: Cardiology

## 2024-02-14 ENCOUNTER — Ambulatory Visit: Payer: Medicare HMO | Attending: Cardiology | Admitting: Cardiology

## 2024-02-14 VITALS — BP 122/76 | HR 90 | Ht 72.0 in | Wt 287.0 lb

## 2024-02-14 DIAGNOSIS — I2510 Atherosclerotic heart disease of native coronary artery without angina pectoris: Secondary | ICD-10-CM

## 2024-02-14 DIAGNOSIS — E782 Mixed hyperlipidemia: Secondary | ICD-10-CM | POA: Diagnosis not present

## 2024-02-14 DIAGNOSIS — I10 Essential (primary) hypertension: Secondary | ICD-10-CM | POA: Diagnosis not present

## 2024-02-14 NOTE — Progress Notes (Addendum)
Clinical Summary Tony Cannon is a 68 y.o.male seen today for follow up of the following medical problems.    1. CAD - noted multivessel disease in 2002, due to anatomy of lesions according to notes multivessel PCI was thought best intervention.   -03/2001 stent to LAD and LCX, 05/2001 stent to RCA. Cath with normal LVEF at that time.   - completed echo which showed LVEF 55-60%, grade I diastolic dysfunction   - 10/2015 exercise MPI showed inferior scar with mild peri-infarct ischemia, LVEF 47%. Exercised 10 minutes with no EKG changes, Duke treadmill score of 10 consistent with low risk for major cardiac events.    - imdur caused headaches, it was stopped     - no chest pain, no SOB/DOE - compliant with meds       2. Hyperlipidemia - had a rash with lipitor, has tolerated mild statin.  09/2020 TC TC 251 HDL 36 TG 210 LDL 178. He was on pravastatin at the time, changed to crestor 20mg  daily. - has not had recent panel since then, in process of changign pcp's   Jan 2023 TC 184 HDL 42 TG 164 LDL 114  - lipids were elevated, we increased crestor to 40mg  daily in Jan 2023 after results    - 07/2023 TC 227 TG 229 HDL 41 LDL 144  -reports limited compliance with statin around 07/2023, now taking daily   3. HTN - he is compliant with meds  -chronc cough, he is on lisinopril.           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 Works 6 months on and 6 months off, modified retirement. Past Medical History:  Diagnosis Date   Coronary atherosclerosis of native coronary artery    Diabetes mellitus without complication (HCC)    Essential hypertension, benign    History of gout    Old myocardial infarction    Other and unspecified hyperlipidemia      No Known Allergies   Current Outpatient Medications  Medication Sig Dispense Refill   amLODipine (NORVASC) 5 MG tablet Take 1 tablet by mouth once daily 90 tablet 0   aspirin EC 81 MG tablet Take 81 mg by mouth daily.      calcium carbonate (TUMS EX) 750 MG chewable tablet Chew 1 tablet by mouth as needed for heartburn.     ibuprofen (ADVIL) 800 MG tablet Take 1 tablet (800 mg total) by mouth every 8 (eight) hours as needed. 90 tablet 1   losartan (COZAAR) 100 MG tablet Take 1 tablet by mouth once daily 30 tablet 2   metoprolol succinate (TOPROL-XL) 25 MG 24 hr tablet Take 1 tablet by mouth once daily 30 tablet 0   Multiple Vitamin (MULTIVITAMIN) capsule Take 1 capsule by mouth daily.     naproxen (NAPROSYN) 500 MG tablet Take 1 tablet (500 mg total) by mouth 2 (two) times daily. 10 tablet 0   nitroGLYCERIN (NITROSTAT) 0.4 MG SL tablet Place 0.4 mg under the tongue every 5 (five) minutes as needed for chest pain.     predniSONE (STERAPRED UNI-PAK 21 TAB) 10 MG (21) TBPK tablet 10 mg DS 12 as directed 48 tablet 0   rosuvastatin (CRESTOR) 40 MG tablet Take 1 tablet by mouth once daily 30 tablet 0   No current facility-administered medications for this visit.     Past Surgical History:  Procedure Laterality Date   CARDIAC CATHETERIZATION     COLONOSCOPY N/A 07/08/2017  Procedure: COLONOSCOPY;  Surgeon: Corbin Ade, MD;  [facility];  Service: Endoscopy;  Laterality: N/A;  2:15 pm   KNEE ARTHROSCOPY WITH LATERAL MENISECTOMY Right 12/19/2017   Procedure: KNEE ARTHROSCOPY WITH LATERAL MENISECTOMY;  Surgeon: Vickki Hearing, MD;  Location: [facility];  Service: Orthopedics;  Laterality: Right;   KNEE ARTHROSCOPY WITH MEDIAL MENISECTOMY Right 12/19/2017   Procedure: KNEE ARTHROSCOPY WITH MEDIAL MENISECTOMY;  Surgeon: Vickki Hearing, MD;  Location: [facility];  Service: Orthopedics;  Laterality: Right;   POLYPECTOMY  07/08/2017   Procedure: POLYPECTOMY;  Surgeon: Corbin Ade, MD;  [facility];  Service: Endoscopy;;  colon     No Known Allergies    Family History  Problem Relation Age of Onset   Severe sprains Neg Hx    Rheumatologic disease Neg Hx    Dislocations Neg Hx       Social History Mr. Hlavacek reports that he quit smoking about 24 years ago. His smoking use included cigarettes. He started smoking about 49 years ago. He has a 37.5 pack-year smoking history. He has never used smokeless tobacco. Mr. Maltese reports current alcohol use of about 14.0 standard drinks of alcohol per week.     Physical Examination Today's Vitals   02/14/24 1401  BP: 122/76  Pulse: 90  SpO2: 97%  Weight: 287 lb (130.2 kg)  Height: 6' (1.829 m)   Body mass index is 38.92 kg/m.  Gen: resting comfortably, no acute distress HEENT: no scleral icterus, pupils equal round and reactive, no palptable cervical adenopathy,  CV: RRR, no mrg, no jvd Resp: Clear to auscultation bilaterally GI: abdomen is soft, non-tender, non-distended, normal bowel sounds, no hepatosplenomegaly MSK: extremities are warm, no edema.  Skin: warm, no rash Neuro:  no focal deficits Psych: appropriate affect   Diagnostic Studies  10/2015 echo Study Conclusions  - Left ventricle: The cavity size was normal. Wall thickness was   increased in a pattern of mild LVH. There was severe focal basal   hypertrophy of the septum. Systolic function was normal. The   estimated ejection fraction was in the range of 55% to 60%. There   is hypokinesis of the basal-midinferolateral and inferior   myocardium. Doppler parameters are consistent with abnormal left   ventricular relaxation (grade 1 diastolic dysfunction). - Right atrium: Central venous pressure (est): 3 mm Hg. - Atrial septum: No defect or patent foramen ovale was identified. - Tricuspid valve: There was physiologic regurgitation. - Pulmonary arteries: Systolic pressure could not be accurately   estimated. - Pericardium, extracardiac: There was no pericardial effusion.  Impressions:  - Mild LVH with severe septal hypertrophy, LVEF 55-60%. Mid to   basal inferior/inferolateral hypokinesis. Grade 1 diastolic   dysfunction.   10/2015  Exercise MPI Blood pressure demonstrated a hypertensive response to exercise. There was no ST segment deviation noted during stress. Frequent PVC's noted. Defect 1: There is a medium defect of severe severity present in the basal inferoseptal, basal inferior, basal inferolateral, mid inferior, mid inferolateral, apical inferior and apical lateral location. Findings consistent with prior myocardial infarction with peri-infarct ischemia. Specifically, there is myocardial scar with a mild degree of peri-infarct ischemia extending from the apex to the base and also involving the inferoseptal and inferolateral walls. This is an intermediate risk study. Nuclear stress EF: 47%.     Assessment and Plan   1. CAD - denies any symptoms, continue current meds - EKG today shows SR, no ischemic changes  2. Hyperlipidemia - repeat lipid panel, reports compliance with crestor 40mg  daily.    3. HTN -he is at goal, continue current meds         Antoine Poche, M.D.

## 2024-02-14 NOTE — Patient Instructions (Signed)
Medication Instructions:  Your physician recommends that you continue on your current medications as directed. Please refer to the Current Medication list given to you today.  *If you need a refill on your cardiac medications before your next appointment, please call your pharmacy*   Lab Work: Fasting Lipid Panel  If you have labs (blood work) drawn today and your tests are completely normal, you will receive your results only by: MyChart Message (if you have MyChart) OR A paper copy in the mail If you have any lab test that is abnormal or we need to change your treatment, we will call you to review the results.   Testing/Procedures: None   Follow-Up: At [facility], you and your health needs are our priority.  As part of our continuing mission to provide you with exceptional heart care, we have created designated Provider Care Teams.  These Care Teams include your primary Cardiologist (physician) and Advanced Practice Providers (APPs -  Physician Assistants and Nurse Practitioners) who all work together to provide you with the care you need, when you need it.  We recommend signing up for the patient portal called "MyChart".  Sign up information is provided on this After Visit Summary.  MyChart is used to connect with patients for Virtual Visits (Telemedicine).  Patients are able to view lab/test results, encounter notes, upcoming appointments, etc.  Non-urgent messages can be sent to your provider as well.   To learn more about what you can do with MyChart, go to [email or web address].    Your next appointment:   6 month(s)  Provider:   You may see Dina Rich, MD or one of the following Advanced Practice Providers on your designated Care Team:   Randall An, PA-C  [name], [place]     Other Instructions

## 2024-02-19 ENCOUNTER — Other Ambulatory Visit: Payer: Self-pay | Admitting: Cardiology

## 2024-02-24 ENCOUNTER — Other Ambulatory Visit (HOSPITAL_COMMUNITY)
Admission: RE | Admit: 2024-02-24 | Discharge: 2024-02-24 | Disposition: A | Discharge status: Home / Self Care | Payer: Medicare HMO | Source: Ambulatory Visit | Attending: Cardiology | Admitting: Cardiology

## 2024-02-24 DIAGNOSIS — E782 Mixed hyperlipidemia: Secondary | ICD-10-CM | POA: Insufficient documentation

## 2024-02-24 LAB — LIPID PANEL
Cholesterol: 126 mg/dL (ref 0–200)
HDL: 32 mg/dL — ABNORMAL LOW (ref >40)
LDL Cholesterol: 74 mg/dL (ref 0–99)
Total CHOL/HDL Ratio: 3.9 ratio
Triglycerides: 102 mg/dL (ref <150)
VLDL: 20 mg/dL (ref 0–40)

## 2024-03-02 ENCOUNTER — Other Ambulatory Visit: Payer: Self-pay | Admitting: Cardiology

## 2024-03-05 DIAGNOSIS — M9903 Segmental and somatic dysfunction of lumbar region: Secondary | ICD-10-CM | POA: Diagnosis not present

## 2024-03-05 DIAGNOSIS — M51362 Other intervertebral disc degeneration, lumbar region with discogenic back pain and lower extremity pain: Secondary | ICD-10-CM | POA: Diagnosis not present

## 2024-03-09 ENCOUNTER — Ambulatory Visit: Payer: Self-pay | Admitting: Nurse Practitioner

## 2024-03-12 DIAGNOSIS — M9903 Segmental and somatic dysfunction of lumbar region: Secondary | ICD-10-CM | POA: Diagnosis not present

## 2024-03-12 DIAGNOSIS — M51362 Other intervertebral disc degeneration, lumbar region with discogenic back pain and lower extremity pain: Secondary | ICD-10-CM | POA: Diagnosis not present

## 2024-03-19 ENCOUNTER — Ambulatory Visit: Payer: Self-pay | Admitting: Nurse Practitioner

## 2024-05-11 ENCOUNTER — Other Ambulatory Visit: Payer: Self-pay | Admitting: Cardiology

## 2024-07-08 DIAGNOSIS — Z79899 Other long term (current) drug therapy: Secondary | ICD-10-CM | POA: Diagnosis not present

## 2024-07-08 DIAGNOSIS — F419 Anxiety disorder, unspecified: Secondary | ICD-10-CM | POA: Diagnosis not present

## 2024-07-08 DIAGNOSIS — Z125 Encounter for screening for malignant neoplasm of prostate: Secondary | ICD-10-CM | POA: Diagnosis not present

## 2024-07-08 DIAGNOSIS — Z0000 Encounter for general adult medical examination without abnormal findings: Secondary | ICD-10-CM | POA: Diagnosis not present

## 2024-07-13 DIAGNOSIS — E119 Type 2 diabetes mellitus without complications: Secondary | ICD-10-CM | POA: Diagnosis not present

## 2024-08-27 DIAGNOSIS — R972 Elevated prostate specific antigen [PSA]: Secondary | ICD-10-CM | POA: Diagnosis not present

## 2024-12-14 NOTE — Progress Notes (Unsigned)
 Cc: Elevated PSA   HPI: This 68 year old male is sent by Dr. Maree for evaluation and management of elevated PSA. Prior PSA data:  08/27/2024--11.2 (6% free) I have no data prior to the above PSA PMH: Past Medical History:  Diagnosis Date   Coronary atherosclerosis of native coronary artery    Diabetes mellitus without complication (HCC)    Essential hypertension, benign    History of gout    Old myocardial infarction    Other and unspecified hyperlipidemia     Surgical History: Past Surgical History:  Procedure Laterality Date   CARDIAC CATHETERIZATION     COLONOSCOPY N/A 07/08/2017   Procedure: COLONOSCOPY;  Surgeon: Shaaron Lamar HERO, MD;  [facility];  Service: Endoscopy;  Laterality: N/A;  2:15 pm   KNEE ARTHROSCOPY WITH LATERAL MENISECTOMY Right 12/19/2017   Procedure: KNEE ARTHROSCOPY WITH LATERAL MENISECTOMY;  Surgeon: Margrette Taft BRAVO, MD;  Location: [facility];  Service: Orthopedics;  Laterality: Right;   KNEE ARTHROSCOPY WITH MEDIAL MENISECTOMY Right 12/19/2017   Procedure: KNEE ARTHROSCOPY WITH MEDIAL MENISECTOMY;  Surgeon: Margrette Taft BRAVO, MD;  Location: [facility];  Service: Orthopedics;  Laterality: Right;   POLYPECTOMY  07/08/2017   Procedure: POLYPECTOMY;  Surgeon: Shaaron Lamar HERO, MD;  [facility];  Service: Endoscopy;;  colon    Home Medications:  Allergies as of 12/15/2024   No Known Allergies      Medication List        Accurate as of December 14, 2024 12:57 PM. If you have any questions, ask your nurse or doctor.          amLODipine  5 MG tablet Commonly known as: NORVASC  TAKE 1 TABLET BY MOUTH ONCE DAILY . APPOINTMENT REQUIRED FOR FUTURE REFILLS   aspirin EC 81 MG tablet Take 81 mg by mouth daily.   calcium  carbonate 750 MG chewable tablet Commonly known as: TUMS EX Chew 1 tablet by mouth as needed for heartburn.   ibuprofen  800 MG tablet Commonly known as: ADVIL  Take 1 tablet (800 mg total) by mouth every 8  (eight) hours as needed.   losartan  100 MG tablet Commonly known as: COZAAR  Take 1 tablet by mouth once daily   metformin 500 MG (OSM) 24 hr tablet Commonly known as: FORTAMET Take 500 mg by mouth daily with breakfast.   metoprolol  succinate 25 MG 24 hr tablet Commonly known as: TOPROL -XL Take 1 tablet by mouth once daily   multivitamin capsule Take 1 capsule by mouth daily.   naproxen  500 MG tablet Commonly known as: NAPROSYN  Take 1 tablet (500 mg total) by mouth 2 (two) times daily.   nitroGLYCERIN  0.4 MG SL tablet Commonly known as: NITROSTAT  Place 0.4 mg under the tongue every 5 (five) minutes as needed for chest pain.   Ozempic (0.25 or 0.5 MG/DOSE) 2 MG/3ML Sopn Generic drug: Semaglutide(0.25 or 0.5MG /DOS)   rosuvastatin  40 MG tablet Commonly known as: CRESTOR  TAKE 1 TABLET BY MOUTH ONCE DAILY . APPOINTMENT REQUIRED FOR FUTURE REFILLS        Allergies: Allergies[1]  Family History: Family History  Problem Relation Age of Onset   Severe sprains Neg Hx    Rheumatologic disease Neg Hx    Dislocations Neg Hx     Social History:  reports that he quit smoking about 25 years ago. His smoking use included cigarettes. He started smoking about 50 years ago. He has a 37.5 pack-year smoking history. He has never used smokeless tobacco. He reports current  alcohol use of about 14.0 standard drinks of alcohol per week. He reports that he does not use drugs.  ROS: All other review of systems were reviewed and are negative except what is noted above in HPI  Physical Exam: There were no vitals taken for this visit.  Constitutional:  Alert and oriented, No acute distress. HEENT: [place] AT, moist mucus membranes.  Trachea midline, no masses. Cardiovascular: No clubbing, cyanosis, or edema. Respiratory: Normal respiratory effort, no increased work of breathing. GI: No inguinal hernias GU: Normal phallus. No masses/lesions on penis, testis, scrotum. Prostate ***g smooth no  nodules no induration.  Lymph: No cervical or inguinal lymphadenopathy. Skin: No rashes, bruises or suspicious lesions. Neurologic: Grossly intact, no focal deficits, moving all 4 extremities. Psychiatric: Normal mood and affect.  Laboratory Data: Lab Results  Component Value Date   WBC 6.2 01/10/2022   HGB 13.6 01/10/2022   HCT 41.4 01/10/2022   MCV 85.7 01/10/2022   PLT 305 01/10/2022    Lab Results  Component Value Date   CREATININE 0.82 01/10/2022    Lab Results  Component Value Date   PSA 4.05 (H) 01/10/2022    No results found for: TESTOSTERONE  Lab Results  Component Value Date   HGBA1C 7.8 (H) 01/10/2022    PCP notes reviewed-only 1 PSA available   IPSS reviewed  Bladder scan reviewed-   Assessment:    Plan:    There are no diagnoses linked to this encounter.  No follow-ups on file.  Garnette CHRISTELLA Shack, MD  [facility] [place]      [1] No Known Allergies

## 2024-12-15 ENCOUNTER — Ambulatory Visit: Admitting: Urology

## 2024-12-15 VITALS — BP 146/72 | HR 88

## 2024-12-15 DIAGNOSIS — R972 Elevated prostate specific antigen [PSA]: Secondary | ICD-10-CM

## 2024-12-15 LAB — BLADDER SCAN AMB NON-IMAGING: Scan Result: 54

## 2024-12-15 NOTE — Progress Notes (Signed)
 Bladder Scan completed today due to reason of MD request   Patient cannot void prior to the bladder scan. Bladder scan result: 54  Performed By: Exie DASEN. CMA  Additional notes- Patient is scheduled to follow up with MD

## 2024-12-16 ENCOUNTER — Other Ambulatory Visit: Payer: Self-pay | Admitting: Urology

## 2024-12-16 ENCOUNTER — Telehealth: Payer: Self-pay

## 2024-12-16 DIAGNOSIS — R972 Elevated prostate specific antigen [PSA]: Secondary | ICD-10-CM

## 2024-12-16 LAB — PSA: Prostate Specific Ag, Serum: 10 ng/mL — ABNORMAL HIGH (ref 0.0–4.0)

## 2024-12-16 MED ORDER — LEVOFLOXACIN 750 MG PO TABS: 750.0000 mg | ORAL_TABLET | Freq: Every day | ORAL | 0 refills | Status: AC

## 2024-12-16 NOTE — Telephone Encounter (Signed)
 Pt's called in today about medication sent to his pharmacy. Pt is advised that someone will call him later today to get his biopsy scheduled and is advised not to take medication until his biopsy procedure. Voiced understanding

## 2024-12-17 ENCOUNTER — Encounter: Payer: Self-pay | Admitting: Nurse Practitioner

## 2024-12-17 ENCOUNTER — Ambulatory Visit: Admitting: Nurse Practitioner

## 2024-12-17 VITALS — BP 138/86 | HR 94 | Ht 73.0 in | Wt 278.8 lb

## 2024-12-17 DIAGNOSIS — I10 Essential (primary) hypertension: Secondary | ICD-10-CM

## 2024-12-17 DIAGNOSIS — E782 Mixed hyperlipidemia: Secondary | ICD-10-CM

## 2024-12-17 DIAGNOSIS — I2510 Atherosclerotic heart disease of native coronary artery without angina pectoris: Secondary | ICD-10-CM | POA: Diagnosis not present

## 2024-12-17 DIAGNOSIS — E785 Hyperlipidemia, unspecified: Secondary | ICD-10-CM | POA: Diagnosis not present

## 2024-12-17 DIAGNOSIS — E669 Obesity, unspecified: Secondary | ICD-10-CM

## 2024-12-17 MED ORDER — NITROGLYCERIN 0.4 MG SL SUBL: 0.4000 mg | SUBLINGUAL_TABLET | SUBLINGUAL | 3 refills | Status: AC | PRN

## 2024-12-17 NOTE — Patient Instructions (Addendum)
Medication Instructions:  Nitroglycerin refilled today Continue all other medications.     Labwork: none  Testing/Procedures: none  Follow-Up: 6 months   Any Other Special Instructions Will Be Listed Below (If Applicable).   If you need a refill on your cardiac medications before your next appointment, please call your pharmacy.  

## 2024-12-17 NOTE — Progress Notes (Unsigned)
°  Cardiology Office Note   Date:  12/17/2024  ID:  Tony Cannon, DOB Aug 29, 1956, MRN [id number] PCP: Rosan Jacquline NOVAK, NP  [facility] Providers Cardiologist:  Alvan Carrier, MD { Click to update primary MD,subspecialty MD or APP then REFRESH:1}    History of Present Illness Tony Cannon is a 68 y.o. male with a PMH of CAD, hyperlipidemia, hypertension, type 2 diabetes, history of gout, who presents today for 71-month follow-up appointment.  Previous cardiovascular history of multivessel disease seen in 2002, due to anatomy of lesions and according to past notes, it was felt that multivessel PCI was thought to be best intervention.  That year, underwent stent to LAD and left circumflex, 2 months later underwent stent to RCA.  LVEF was normal at that time.  Exercise MPI in 2016 showed inferior scar with mild peri-infarct ischemia, LVEF 47%.  Findings were consistent with low risk for major cardiac events.  Last seen by Dr. Alvan on February 14, 2024.  He was doing well at that time.  Today he presents for 11-month follow-up appointment.  He states  ROS: ***  Studies Reviewed      *** Risk Assessment/Calculations {Does this patient have ATRIAL FIBRILLATION?:[phone]} No BP recorded.  {Refresh Note OR Click here to enter BP  :1}***       Physical Exam VS:  There were no vitals taken for this visit.       Wt Readings from Last 3 Encounters:  02/14/24 287 lb (130.2 kg)  10/23/23 291 lb (132 kg)  10/18/23 290 lb (131.5 kg)    GEN: Well nourished, well developed in no acute distress NECK: No JVD; No carotid bruits CARDIAC: ***RRR, no murmurs, rubs, gallops RESPIRATORY:  Clear to auscultation without rales, wheezing or rhonchi  ABDOMEN: Soft, non-tender, non-distended EXTREMITIES:  No edema; No deformity   ASSESSMENT AND PLAN ***    {Are you ordering a CV Procedure (e.g. stress test, cath, DCCV, TEE, etc)?   Press F2        :[id number]}  Dispo:  ***  Signed, Almarie Crate, NP

## 2024-12-18 ENCOUNTER — Ambulatory Visit: Payer: Self-pay

## 2024-12-18 DIAGNOSIS — R972 Elevated prostate specific antigen [PSA]: Secondary | ICD-10-CM

## 2024-12-18 MED ORDER — LEVOFLOXACIN 750 MG PO TABS: 750.0000 mg | ORAL_TABLET | Freq: Once | ORAL | 0 refills | Status: AC

## 2024-12-18 NOTE — Telephone Encounter (Signed)
-----   Message from Garnette Shack, MD sent at 12/16/2024  8:49 AM EST ----- Please call pt--psa still high @ 10--I would rec TRUS/Bx--I put orders in

## 2024-12-18 NOTE — Telephone Encounter (Signed)
 Called pt to make him aware of MD recommendations pt voiced his understanding and advised Bx instructions will be mialed to his verified address pt voiced his understanding

## 2024-12-18 NOTE — Telephone Encounter (Signed)
 Called patient making him aware PSA labs routed to MD, D[place]stedt for review and someone will reach out with MD response. Voiced understanding

## 2024-12-21 NOTE — Progress Notes (Signed)
 Patient notified and verbalized understanding.  He agrees to lipid clinic referral here in [place].   Order placed & pcc made aware.

## 2024-12-22 NOTE — Addendum Note (Signed)
 Addended by: Kytzia Gienger G on: 12/22/2024 01:41 PM   Modules accepted: Orders

## 2025-01-25 NOTE — Progress Notes (Unsigned)
 Patient ID: Kolyn Rozario Durango                 DOB: February 14, 1956                    MRN: [id number]      HPI: Jett Fukuda Tedrick is a 69 y.o. male patient referred to lipid clinic by Almarie Crate, NP. PMH is significant for HLD, CAD multivessel PCI, HTN, T2DM.    Reviewed options for lowering LDL cholesterol, including ezetimibe , PCSK-9 inhibitors, bempedoic acid and inclisiran.  Discussed mechanisms of action, dosing, side effects and potential decreases in LDL cholesterol.  Also reviewed cost information and potential options for patient assistance.   Current Medications: rosuvastatin  40 mg daily  Intolerances: none Risk Factors: age, CAD, HTN, T2DM LDL goal: <70  Lipid panel (02/2024): Chol 158, Trig 119, HDL 37, LDL 99 Liver enzymes (06/2024): AST 19, ALT 20, Alk phos 59  Diet:  Breakfast: Lunch/Dinner: Snacks: Beverages:  Exercise:   Family History:   Relation Problem Comments  Mother (Deceased)   Father (Deceased)   Neg Hx Dislocations   Rheumatologic disease   Severe sprains      Social History:  Alcohol: Smoking:  Labs:  Lipid Panel     Component Value Date/Time   CHOL 126 02/24/2024 0824   TRIG 102 02/24/2024 0824   HDL 32 (L) 02/24/2024 0824   CHOLHDL 3.9 02/24/2024 0824   VLDL 20 02/24/2024 0824   LDLCALC 74 02/24/2024 0824   LDLCALC 114 (H) 01/10/2022 0950   LDLDIRECT 235.4 07/09/2013 1004    Past Medical History:  Diagnosis Date   Coronary atherosclerosis of native coronary artery    Diabetes mellitus without complication (HCC)    Essential hypertension, benign    History of gout    Old myocardial infarction    Other and unspecified hyperlipidemia     Medications Ordered Prior to Encounter[1]  Allergies[2]  Assessment/Plan:  1. Hyperlipidemia -  No problems updated. No problem-specific Assessment & Plan notes found for this encounter.    Thank you,  Pratik Dalziel E. Sabien Umland, Pharm.D, CPP Payette Elspeth BIRCH. [facility] [address], [place], [place] [address] Phone: [phone]; Fax: [phone]        [1]  Current Outpatient Medications on File Prior to Visit  Medication Sig Dispense Refill   amLODipine  (NORVASC ) 5 MG tablet TAKE 1 TABLET BY MOUTH ONCE DAILY . APPOINTMENT REQUIRED FOR FUTURE REFILLS 90 tablet 3   aspirin EC 81 MG tablet Take 81 mg by mouth daily.     calcium  carbonate (TUMS EX) 750 MG chewable tablet Chew 1 tablet by mouth as needed for heartburn.     ibuprofen  (ADVIL ) 800 MG tablet Take 1 tablet (800 mg total) by mouth every 8 (eight) hours as needed. 90 tablet 1   losartan  (COZAAR ) 100 MG tablet Take 1 tablet by mouth once daily 90 tablet 2   metformin (FORTAMET) 500 MG (OSM) 24 hr tablet Take 500 mg by mouth daily with breakfast.     metoprolol  succinate (TOPROL -XL) 25 MG 24 hr tablet Take 1 tablet by mouth once daily 90 tablet 3   Multiple Vitamin (MULTIVITAMIN) capsule Take 1 capsule by mouth daily.     naproxen  (NAPROSYN ) 500 MG tablet Take 1 tablet (500 mg total) by mouth 2 (two) times daily. 10 tablet 0   nitroGLYCERIN  (NITROSTAT ) 0.4 MG SL tablet Place 1 tablet (0.4 mg total)  under the tongue every 5 (five) minutes as needed for chest pain. 25 tablet 3   OZEMPIC, 0.25 OR 0.5 MG/DOSE, 2 MG/3ML SOPN      OZEMPIC, 1 MG/DOSE, 4 MG/3ML SOPN once a week.     rosuvastatin  (CRESTOR ) 40 MG tablet TAKE 1 TABLET BY MOUTH ONCE DAILY . APPOINTMENT REQUIRED FOR FUTURE REFILLS 90 tablet 3   No current facility-administered medications on file prior to visit.  [2] No Known Allergies

## 2025-01-26 ENCOUNTER — Ambulatory Visit (HOSPITAL_COMMUNITY): Attending: Student

## 2025-01-26 ENCOUNTER — Other Ambulatory Visit: Payer: Self-pay

## 2025-01-26 ENCOUNTER — Ambulatory Visit

## 2025-01-26 DIAGNOSIS — M5416 Radiculopathy, lumbar region: Secondary | ICD-10-CM | POA: Diagnosis present

## 2025-01-26 DIAGNOSIS — M545 Low back pain, unspecified: Secondary | ICD-10-CM | POA: Diagnosis present

## 2025-01-26 NOTE — Therapy (Addendum)
 " OUTPATIENT PHYSICAL THERAPY THORACOLUMBAR EVALUATION   Patient Name: Tony Cannon MRN: [id number] DOB:01-07-1956, 69 y.o., male Today's Date: 01/26/2025  END OF SESSION:  PT End of Session - 01/26/25 0950     Visit Number 1    Number of Visits 8    Date for Recertification  02/26/25    Authorization Type HTA    Authorization Time Period no auth needed    PT Start Time 0950    PT Stop Time 1030    PT Time Calculation (min) 40 min    Activity Tolerance Patient tolerated treatment well    Behavior During Therapy [facility] for tasks assessed/performed          Past Medical History:  Diagnosis Date   Coronary atherosclerosis of native coronary artery    Diabetes mellitus without complication (HCC)    Essential hypertension, benign    History of gout    Old myocardial infarction    Other and unspecified hyperlipidemia    Past Surgical History:  Procedure Laterality Date   CARDIAC CATHETERIZATION     COLONOSCOPY N/A 07/08/2017   Procedure: COLONOSCOPY;  Surgeon: Shaaron Lamar HERO, MD;  Location: [facility];  Service: Endoscopy;  Laterality: N/A;  2:15 pm   KNEE ARTHROSCOPY WITH LATERAL MENISECTOMY Right 12/19/2017   Procedure: KNEE ARTHROSCOPY WITH LATERAL MENISECTOMY;  Surgeon: Margrette Taft BRAVO, MD;  Location: [facility] ORS;  Service: Orthopedics;  Laterality: Right;   KNEE ARTHROSCOPY WITH MEDIAL MENISECTOMY Right 12/19/2017   Procedure: KNEE ARTHROSCOPY WITH MEDIAL MENISECTOMY;  Surgeon: Margrette Taft BRAVO, MD;  Location: [facility] ORS;  Service: Orthopedics;  Laterality: Right;   POLYPECTOMY  07/08/2017   Procedure: POLYPECTOMY;  Surgeon: Shaaron Lamar HERO, MD;  Location: [facility];  Service: Endoscopy;;  colon   Patient Active Problem List   Diagnosis Date Noted   S/P right knee arthroscopy 12/19/17 12/25/2017   Medial meniscus, posterior horn derangement, right    Meniscus, lateral, derangement, right    OLD MYOCARDIAL INFARCTION 02/28/2010   HYPERLIPIDEMIA-MIXED 02/01/2009    HYPERTENSION, BENIGN 02/01/2009   CAD, NATIVE VESSEL 02/01/2009    PCP: Rosan Jacquline NOVAK, NP  REFERRING PROVIDER: Rosan Jacquline NOVAK, NP  REFERRING DIAG: M54.50 (ICD-10-CM) - Low back pain, unspecified  Rationale for Evaluation and Treatment: Rehabilitation  THERAPY DIAG:  Low back pain, unspecified back pain laterality, unspecified chronicity, unspecified whether sciatica present  Radiculopathy, lumbar region  ONSET DATE: chronic low back pain  SUBJECTIVE:  SUBJECTIVE STATEMENT: Going on about 2 months; had kind of went away and now its back some; right side glute; previously was going down to back of knee.  Told PCP about it and she referred to PT.  Gave prednisone ; that helped but returned after the taper ended.  Has lost 40 lbs since last year  PERTINENT HISTORY:  Knee surgeries per DR. Harrison  PAIN:  Are you having pain? Yes: NPRS scale: 2/10 today; 10/10 at worst; 0/10 at best Pain location: right glute Pain description: aching Aggravating factors: unknown Relieving factors: medication, chiropractor past 10 years; heat  PRECAUTIONS: None  RED FLAGS: None   WEIGHT BEARING RESTRICTIONS: No  FALLS:  Has patient fallen in last 6 months? 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  DIAGNOSTIC FINDINGS:  Narrative & Impression  CLINICAL DATA:  Severe low back pain with gluteal and right leg pain.   EXAM: MRI LUMBAR SPINE WITHOUT CONTRAST   TECHNIQUE: Multiplanar, multisequence MR imaging of the lumbar spine was performed. No  intravenous contrast was administered.   COMPARISON:  None Available.   FINDINGS: Segmentation:  Standard.   Alignment:  Mild dextroscoliosis   Vertebrae:  No fracture, evidence of discitis, or bone lesion.   Conus medullaris and cauda equina: Conus extends to the L1 level. Conus and cauda equina appear normal.   Paraspinal and other soft tissues: Negative for perispinal mass or inflammation   Disc levels:   T12- L1: Unremarkable.   L1-L2: Unremarkable.   L2-L3: Small left inferior foraminal protrusion.   L3-L4: Degenerative facet spurring with joint effusions and ligamentum flavum thickening. The disc is narrowed and bulging with small bilateral foraminal protrusion. Patent canal and foramina   L4-L5: Leftward predominant disc bulging and facet spurring. No neural impingement   L5-S1:Disc bulging with right foraminal protrusion. Degenerative facet spurring on both sides. Moderate right foraminal stenosis.   IMPRESSION: 1. Generalized lumbar spine degeneration with mild scoliosis. 2. 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 3. L5-S1 moderate right foraminal narrowing.      PATIENT SURVEYS:  Modified Oswestry:  MODIFIED OSWESTRY DISABILITY SCALE  Date: 01/26/2025 Score                                Total 10/50; 20%   Interpretation of scores: Score Category Description  0-20% Minimal Disability The patient can cope with most living activities. Usually no treatment is indicated apart from advice on lifting, sitting and exercise  21-40% Moderate Disability The patient experiences more pain and difficulty with sitting, lifting and standing. Travel and social life are more difficult and they may be disabled from work. Personal care, sexual activity and sleeping are not grossly affected, and the patient can usually be managed by conservative means  41-60% Severe Disability Pain remains the main problem in this group, but activities of daily living  are affected. These patients require a detailed investigation  61-80% Crippled Back pain impinges on all aspects of the patients life. Positive intervention is required  81-100% Bed-bound These patients are either bed-bound or exaggerating their symptoms  Bluford BRAVO, [place] DELENA Karon DELENA, et al. Surgery versus conservative management of stable thoracolumbar fracture: the PRESTO feasibility  RCT. Ruben ([place]): NI[employer] Journals Library; 2021 Nov. [facility] Technology Assessment, No. 25.62.) Appendix 3, Oswestry Disability Index category descriptors. Available from: Findjewelers.cz  Minimally Clinically Important Difference (MCID) = 12.8%  COGNITION: Overall cognitive status: Within functional limits for tasks assessed     SENSATION: WFL  MUSCLE LENGTH: Hamstrings: check next visit POSTURE: rounded shoulders, forward head, and increased lumbar lordosis  PALPATION: Tender right piriformis and glute  LUMBAR ROM: *familiar pain  AROM eval  Flexion Fingertips to 4 above ankle *familiar pain  Extension 40% available; mild pain  Right lateral flexion Finger tip to knee joint line  Left lateral flexion Finger tip to knee joint line  Right rotation   Left rotation    (Blank rows = not tested)  LOWER EXTREMITY ROM:     Active  Right eval Left eval  Hip flexion 4+ 5  Hip extension 4- 4+  Hip abduction    Hip adduction    Hip internal rotation    Hip external rotation    Knee flexion 4+ 5  Knee extension 4+* 5  Ankle dorsiflexion 5 5  Ankle plantarflexion    Ankle inversion    Ankle eversion     (Blank rows = not tested)  LOWER EXTREMITY MMT:    MMT Right eval Left eval  Hip flexion    Hip extension    Hip abduction    Hip adduction    Hip internal rotation    Hip external rotation    Knee flexion    Knee extension    Ankle dorsiflexion    Ankle plantarflexion    Ankle inversion    Ankle eversion     (Blank rows = not  tested)    FUNCTIONAL TESTS:  5 times sit to stand: 14.08 no UE assist  GAIT: Distance walked: 50 ft in clinic Assistive device utilized: None Level of assistance: Modified independence Comments: no significant gait deviations noted  TREATMENT DATE: 01/26/25 physical therapy evaluation and HEP instruction                                                                                                                                 PATIENT EDUCATION:  Education details: Patient educated on exam findings, POC, scope of PT, HEP, and spinal and core anatomy. Person educated: Patient Education method: Explanation, Demonstration, and Handouts Education comprehension: verbalized understanding, returned demonstration, verbal cues required, and tactile cues required  HOME EXERCISE PROGRAM: Access Code: [email or web address] URL: [email or web address] Date: 01/26/2025 Prepared by: [facility] - [facility]  Exercises - Supine Piriformis Stretch with Leg Straight  - 2 x daily - 7 x weekly - 1 sets - 5 reps - 20  sec hold - Supine Figure 4 Piriformis Stretch  - 2 x daily - 7 x weekly - 1 sets - 5 reps - 20 sec hold - Supine Transversus Abdominis Bracing - Hands on Stomach  - 2 x daily - 7 x weekly -  1 sets - 10 reps - 5 sec hold  ASSESSMENT:  CLINICAL IMPRESSION: Patient is a 69 y.o. male who was seen today for physical therapy evaluation and treatment for M54.50 (ICD-10-CM) - Low back pain, unspecified. Patient demonstrates muscle weakness, reduced ROM, and fascial restrictions which are likely contributing to symptoms of pain and are negatively impacting patient ability to perform ADLs and functional mobility tasks. Patient will benefit from skilled physical therapy services to address these deficits to reduce pain and improve level of function with ADLs and functional mobility tasks.   OBJECTIVE IMPAIRMENTS: decreased activity tolerance, difficulty walking, decreased strength, increased fascial  restrictions, impaired perceived functional ability, postural dysfunction, and pain.   ACTIVITY LIMITATIONS: carrying, lifting, bending, sitting, standing, sleeping, and locomotion level  PARTICIPATION LIMITATIONS: community activity and occupation  PERSONAL FACTORS: Time since onset of injury/illness/exacerbation are also affecting patient's functional outcome.   REHAB POTENTIAL: Good  CLINICAL DECISION MAKING: Evolving/moderate complexity  EVALUATION COMPLEXITY: Moderate   GOALS: Goals reviewed with patient? No  SHORT TERM GOALS: Target date: 02/09/2025  patient will be independent with initial HEP and compliant with HEP 3-4 times a week   Baseline: Goal status: INITIAL  2.  Patient will report 50% improvement overall  Baseline:  Goal status: INITIAL    LONG TERM GOALS: Target date: 02/23/2025  Patient will be independent in self management strategies to improve quality of life and functional outcomes.  Baseline:  Goal status: INITIAL  2.  Patient will report 70% improvement overall  Baseline:  Goal status: INITIAL  3.   Patient will increase bilateral leg MMT's to 5/5 to allow navigation of steps without gait deviation or loss of balance  Baseline: see above Goal status: INITIAL  4.  Patient will improve Modified Oswestry score by 5 points to demonstrate improved perceived function  Baseline: 10/50 Goal status: INITIAL  PLAN:  PT FREQUENCY: 2x/week  PT DURATION: 4 weeks  PLANNED INTERVENTIONS: [id number]- PT Re-evaluation, 97110-Therapeutic exercises, 97530- Therapeutic activity, 97112- Neuromuscular re-education, 97535- Self Care, [id number]- Manual therapy, [id number]- Gait training, [phone]- Orthotic Fit/training, [phone]- Canalith repositioning, [id number]- Aquatic Therapy, [phone]- Splinting, [phone]- Wound care (first 20 sq cm), 97598- Wound care (each additional 20 sq cm)Patient/Family education, Balance training, Stair training, Taping, Dry Needling, Joint mobilization,  Joint manipulation, Spinal manipulation, Spinal mobilization, Scar mobilization, and DME instructions. SABRA  PLAN FOR NEXT SESSION: Review HEP and goals; lumbar mobility and core strength; decompression; piriformis stretching; check hamstring length.    10:42 AM, 01/26/25 Nikea Settle Small Spiros Greenfeld MPT [facility] [place] [phone] Ph:[phone]  "

## 2025-01-27 NOTE — Procedures (Unsigned)
 TRANSRECTAL ULTRASOUND AND PROSTATE BIOPSY  Indication:  {TRUSBX indications:26398}  Prophylactic antibiotic administration: {TRUS antibiotics:26399}  All medications that could result in increased bleeding were discontinued within an appropriate period of the time of biopsy.  Risk including bleeding and infection were discussed.  Informed consent was obtained.  The patient was placed in the left lateral decubitus position.  PROCEDURE 1.  TRANSRECTAL ULTRASOUND OF THE PROSTATE  The 7 MHz transrectal probe was used to image the prostate.  Anal stenosis {WAS/WAS NOT:[phone]::was not} noted.  TRUS volume: *** ml  Hypoechoic areas: {prostate anatomy:26400}  Hyperechoic areas: {prostate anatomy:26400}  Central calcifications: {DESC; PRESENT/NOT PRESENT:21021351}  Margins:  {Normal/Abnormal Appearance:21344::normal}  Seminal Vesicles: {Normal/Abnormal Appearance:21344::normal}   PROCEDURE 2:  PROSTATE BIOPSY  A periprostatic block was performed using 1% lidocaine  and transrectal ultrasound guidance. Under transrectal ultrasound guidance, and using the Biopty gun, prostate biopsies were obtained systematically from the apex, mid gland, and base bilaterally.  A total of *** cores were obtained.  Hemostasis was obtained with gentle pressure on the prostate.  The procedures were well-tolerated.  No significant bleeding was noted at the end of the procedure.  The patient was stable for discharge from the office.

## 2025-01-28 ENCOUNTER — Ambulatory Visit: Admitting: Cardiology

## 2025-01-28 ENCOUNTER — Telehealth: Payer: Self-pay | Admitting: Urology

## 2025-01-28 NOTE — Telephone Encounter (Signed)
 Patient left message he wants to reschedule biopsy appt

## 2025-02-02 ENCOUNTER — Ambulatory Visit (HOSPITAL_COMMUNITY): Admission: RE | Admit: 2025-02-02 | Source: Ambulatory Visit

## 2025-02-02 ENCOUNTER — Other Ambulatory Visit: Admitting: Urology

## 2025-02-02 NOTE — Telephone Encounter (Signed)
 Dates extended ! Thank you !

## 2025-02-04 ENCOUNTER — Encounter (HOSPITAL_COMMUNITY): Payer: Self-pay

## 2025-02-04 ENCOUNTER — Ambulatory Visit (HOSPITAL_COMMUNITY)

## 2025-02-04 DIAGNOSIS — M545 Low back pain, unspecified: Secondary | ICD-10-CM

## 2025-02-04 DIAGNOSIS — M5416 Radiculopathy, lumbar region: Secondary | ICD-10-CM

## 2025-02-04 NOTE — Therapy (Signed)
 " OUTPATIENT PHYSICAL THERAPY THORACOLUMBAR TREATMENT    Patient Name: Tony Cannon MRN: [id number] DOB:07-20-1956, 69 y.o., male Today's Date: 02/04/2025  END OF SESSION:  PT End of Session - 02/04/25 1337     Visit Number 2    Number of Visits 8    Date for Recertification  02/26/25    Authorization Type HTA    Authorization Time Period no auth needed    PT Start Time 1335    PT Stop Time 1415    PT Time Calculation (min) 40 min    Activity Tolerance Patient tolerated treatment well    Behavior During Therapy WFL for tasks assessed/performed          Past Medical History:  Diagnosis Date   Coronary atherosclerosis of native coronary artery    Diabetes mellitus without complication (HCC)    Essential hypertension, benign    History of gout    Old myocardial infarction    Other and unspecified hyperlipidemia    Past Surgical History:  Procedure Laterality Date   CARDIAC CATHETERIZATION     COLONOSCOPY N/A 07/08/2017   Procedure: COLONOSCOPY;  Surgeon: Shaaron Lamar HERO, MD;  Location: [facility];  Service: Endoscopy;  Laterality: N/A;  2:15 pm   KNEE ARTHROSCOPY WITH LATERAL MENISECTOMY Right 12/19/2017   Procedure: KNEE ARTHROSCOPY WITH LATERAL MENISECTOMY;  Surgeon: Margrette Taft BRAVO, MD;  Location: [facility];  Service: Orthopedics;  Laterality: Right;   KNEE ARTHROSCOPY WITH MEDIAL MENISECTOMY Right 12/19/2017   Procedure: KNEE ARTHROSCOPY WITH MEDIAL MENISECTOMY;  Surgeon: Margrette Taft BRAVO, MD;  Location: [facility];  Service: Orthopedics;  Laterality: Right;   POLYPECTOMY  07/08/2017   Procedure: POLYPECTOMY;  Surgeon: Shaaron Lamar HERO, MD;  Location: [facility];  Service: Endoscopy;;  colon   Patient Active Problem List   Diagnosis Date Noted   S/P right knee arthroscopy 12/19/17 12/25/2017   Medial meniscus, posterior horn derangement, right    Meniscus, lateral, derangement, right    OLD MYOCARDIAL INFARCTION 02/28/2010   HYPERLIPIDEMIA-MIXED 02/01/2009    HYPERTENSION, BENIGN 02/01/2009   CAD, NATIVE VESSEL 02/01/2009    PCP: Rosan Jacquline NOVAK, NP  REFERRING PROVIDER: Rosan Jacquline NOVAK, NP  REFERRING DIAG: M54.50 (ICD-10-CM) - Low back pain, unspecified  Rationale for Evaluation and Treatment: Rehabilitation  THERAPY DIAG:  Low back pain, unspecified back pain laterality, unspecified chronicity, unspecified whether sciatica present  Radiculopathy, lumbar region  ONSET DATE: chronic low back pain  SUBJECTIVE:  SUBJECTIVE STATEMENT: Patient reports 0/10 pain today. Reports he fell the day after his first visit. Slipped on ice. But he feels fine now. Hasn't done HEP yet. But reports no questions or issues.   EVAL: Going on about 2 months; had kind of went away and now its back some; right side glute; previously was going down to back of knee.  Told PCP about it and she referred to PT.  Gave prednisone ; that helped but returned after the taper ended.  Has lost 40 lbs since last year  PERTINENT HISTORY:  Knee surgeries per DR. Harrison  PAIN:  Are you having pain? Yes: NPRS scale: 2/10 today; 10/10 at worst; 0/10 at best Pain location: right glute Pain description: aching Aggravating factors: unknown Relieving factors: medication, chiropractor past 10 years; heat  PRECAUTIONS: None  RED FLAGS: None   WEIGHT BEARING RESTRICTIONS: No  FALLS:  Has patient fallen in last 6 months? 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  DIAGNOSTIC FINDINGS:  Narrative & Impression  CLINICAL DATA:  Severe  low back pain with gluteal and right leg pain.   EXAM: MRI LUMBAR SPINE WITHOUT CONTRAST   TECHNIQUE: Multiplanar, multisequence MR imaging of the lumbar spine was performed. No intravenous contrast was administered.   COMPARISON:  None Available.   FINDINGS: Segmentation:  Standard.   Alignment:  Mild dextroscoliosis   Vertebrae:  No fracture, evidence of discitis, or bone lesion.   Conus medullaris and cauda equina: Conus extends to the L1 level. Conus and cauda equina appear normal.   Paraspinal and other soft tissues: Negative for perispinal mass or inflammation   Disc levels:   T12- L1: Unremarkable.   L1-L2: Unremarkable.   L2-L3: Small left inferior foraminal protrusion.   L3-L4: Degenerative facet spurring with joint effusions and ligamentum flavum thickening. The disc is narrowed and bulging with small bilateral foraminal protrusion. Patent canal and foramina   L4-L5: Leftward predominant disc bulging and facet spurring. No neural impingement   L5-S1:Disc bulging with right foraminal protrusion. Degenerative facet spurring on both sides. Moderate right foraminal stenosis.   IMPRESSION: 1. Generalized lumbar spine degeneration with mild scoliosis. 2. 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 3. L5-S1 moderate right foraminal narrowing.      PATIENT SURVEYS:  Modified Oswestry:  MODIFIED OSWESTRY DISABILITY SCALE  Date: 01/26/2025 Score                                Total 10/50; 20%   Interpretation of scores: Score Category Description  0-20% Minimal Disability The patient can cope with most living activities. Usually no treatment is indicated apart from advice on lifting, sitting and exercise  21-40% Moderate Disability The patient experiences more pain and difficulty with sitting, lifting and standing. Travel and social life are more difficult and they may be disabled from work. Personal care, sexual activity and sleeping are  not grossly affected, and the patient can usually be managed by conservative means  41-60% Severe Disability Pain remains the main problem in this group, but activities of daily living are affected. These patients require a detailed investigation  61-80% Crippled Back pain impinges on all aspects of the patients life. Positive  intervention is required  81-100% Bed-bound These patients are either bed-bound or exaggerating their symptoms  Bluford FORBES Zoe DELENA Karon DELENA, et al. Surgery versus conservative management of stable thoracolumbar fracture: the PRESTO feasibility RCT. [place] ([place]): [employer]; 2021 Nov. [facility] [employer] Assessment, No. 25.62.) Appendix 3, Oswestry Disability Index category descriptors. Available from: Findjewelers.cz  Minimally Clinically Important Difference (MCID) = 12.8%  COGNITION: Overall cognitive status: Within functional limits for tasks assessed     SENSATION: WFL  MUSCLE LENGTH: Hamstrings: check next visit POSTURE: rounded shoulders, forward head, and increased lumbar lordosis  PALPATION: Tender right piriformis and glute  LUMBAR ROM: *familiar pain  AROM eval  Flexion Fingertips to 4 above ankle *familiar pain  Extension 40% available; mild pain  Right lateral flexion Finger tip to knee joint line  Left lateral flexion Finger tip to knee joint line  Right rotation   Left rotation    (Blank rows = not tested)  LOWER EXTREMITY ROM:     Active  Right eval Left eval  Hip flexion 4+ 5  Hip extension 4- 4+  Hip abduction    Hip adduction    Hip internal rotation    Hip external rotation    Knee flexion 4+ 5  Knee extension 4+* 5  Ankle dorsiflexion 5 5  Ankle plantarflexion    Ankle inversion    Ankle eversion     (Blank rows = not tested)  LOWER EXTREMITY MMT:    MMT Right eval Left eval  Hip flexion    Hip extension    Hip abduction    Hip adduction    Hip internal rotation     Hip external rotation    Knee flexion    Knee extension    Ankle dorsiflexion    Ankle plantarflexion    Ankle inversion    Ankle eversion     (Blank rows = not tested)    FUNCTIONAL TESTS:  5 times sit to stand: 14.08 no UE assist  GAIT: Distance walked: 50 ft in clinic Assistive device utilized: None Level of assistance: Modified independence Comments: no significant gait deviations noted  TREATMENT DATE:  02/04/25: Review of goals Review of HEP:  -Supine piriformis stretch, 3x 20 each side  -TA bracing, 5 holds, 2x10 Hamstring length testing: ~155 bilaterally  Hamstring stretch, supine with strap, 20x3 LTR, 2', verbal cues for form  SKTC, 5 holds, alternating, 2'  Bridge, 2x10, 3 holds NuStep, seat 13, level, 2, 5'   01/26/25 physical therapy evaluation and HEP instruction                                                                                                                                 PATIENT EDUCATION:  Education details: Patient educated on exam findings, POC, scope of PT, HEP, and spinal and core anatomy. Person educated: Patient Education method: Explanation, Demonstration, and Handouts Education comprehension: verbalized understanding, returned demonstration, verbal  cues required, and tactile cues required  HOME EXERCISE PROGRAM: Access Code: [email or web address] URL: [email or web address] Date: 02/04/2025 Prepared by:   Exercises - Supine Piriformis Stretch with Leg Straight  - 2 x daily - 7 x weekly - 1 sets - 5 reps - 20  sec hold - Supine Figure 4 Piriformis Stretch  - 2 x daily - 7 x weekly - 1 sets - 5 reps - 20 sec hold - Supine Transversus Abdominis Bracing - Hands on Stomach  - 2 x daily - 7 x weekly - 1 sets - 10 reps - 5 sec hold - Hooklying Hamstring Stretch with Strap  - 2 x daily - 7 x weekly - 1 sets - 5 reps - 20 hold  ASSESSMENT:  CLINICAL IMPRESSION: Patient arrives to session with reports of decreased pain.  Began session with a review of goals and HEP. Patient requires a little verbal instruction/cueing for HEP as he reports he hasn't completed it since evaluation. Patient demonstrated some mild tightness in hamstrings this date. Addressed in session and added to HEP. Remainder of session spent continuing to address lumbar mobility and hip extension strengthening. Pt tolerates all well with no reports of increased pain at EOS. Patient will benefit from continued skilled physical therapy in order to continue to address pain and strength deficits to improve overall function.    EVAL: Patient is a 69 y.o. male who was seen today for physical therapy evaluation and treatment for M54.50 (ICD-10-CM) - Low back pain, unspecified. Patient demonstrates muscle weakness, reduced ROM, and fascial restrictions which are likely contributing to symptoms of pain and are negatively impacting patient ability to perform ADLs and functional mobility tasks. Patient will benefit from skilled physical therapy services to address these deficits to reduce pain and improve level of function with ADLs and functional mobility tasks.   OBJECTIVE IMPAIRMENTS: decreased activity tolerance, difficulty walking, decreased strength, increased fascial restrictions, impaired perceived functional ability, postural dysfunction, and pain.   ACTIVITY LIMITATIONS: carrying, lifting, bending, sitting, standing, sleeping, and locomotion level  PARTICIPATION LIMITATIONS: community activity and occupation  PERSONAL FACTORS: Time since onset of injury/illness/exacerbation are also affecting patient's functional outcome.   REHAB POTENTIAL: Good  CLINICAL DECISION MAKING: Evolving/moderate complexity  EVALUATION COMPLEXITY: Moderate   GOALS: Goals reviewed with patient? Yes  SHORT TERM GOALS: Target date: 02/09/2025  patient will be independent with initial HEP and compliant with HEP 3-4 times a week   Baseline: Goal status: INITIAL  2.   Patient will report 50% improvement overall  Baseline:  Goal status: INITIAL    LONG TERM GOALS: Target date: 02/23/2025  Patient will be independent in self management strategies to improve quality of life and functional outcomes.  Baseline:  Goal status: INITIAL  2.  Patient will report 70% improvement overall  Baseline:  Goal status: INITIAL  3.   Patient will increase bilateral leg MMT's to 5/5 to allow navigation of steps without gait deviation or loss of balance  Baseline: see above Goal status: INITIAL  4.  Patient will improve Modified Oswestry score by 5 points to demonstrate improved perceived function  Baseline: 10/50 Goal status: INITIAL  PLAN:  PT FREQUENCY: 2x/week  PT DURATION: 4 weeks  PLANNED INTERVENTIONS: [id number]- PT Re-evaluation, 97110-Therapeutic exercises, 97530- Therapeutic activity, 97112- Neuromuscular re-education, 97535- Self Care, [id number]- Manual therapy, [id number]- Gait training, [phone]- Orthotic Fit/training, [id number]- Canalith repositioning, [id number]- Aquatic Therapy, [id number]- Splinting, [id number]- Wound care (first 20 sq cm), [id number]- Wound care (each additional 20 sq cm)Patient/Family education,  Balance training, Stair training, Taping, Dry Needling, Joint mobilization, Joint manipulation, Spinal manipulation, Spinal mobilization, Scar mobilization, and DME instructions. SABRA  PLAN FOR NEXT SESSION:  cont lumbar mobility and core strength; decompression exercises;     2:36 PM, 02/04/25 Rosaria Settler, PT, DPT [facility] [place] "

## 2025-02-09 ENCOUNTER — Ambulatory Visit (HOSPITAL_COMMUNITY): Admitting: Physical Therapy

## 2025-02-11 ENCOUNTER — Ambulatory Visit (HOSPITAL_COMMUNITY): Admitting: Physical Therapy

## 2025-02-16 ENCOUNTER — Ambulatory Visit (HOSPITAL_COMMUNITY): Admitting: Physical Therapy

## 2025-02-18 ENCOUNTER — Ambulatory Visit (HOSPITAL_COMMUNITY)

## 2025-02-23 ENCOUNTER — Ambulatory Visit (HOSPITAL_COMMUNITY)

## 2025-02-23 ENCOUNTER — Ambulatory Visit (HOSPITAL_COMMUNITY): Admission: RE | Admit: 2025-02-23

## 2025-02-23 ENCOUNTER — Other Ambulatory Visit: Admitting: Urology

## 2025-02-25 ENCOUNTER — Ambulatory Visit (HOSPITAL_COMMUNITY)

## 2025-03-02 ENCOUNTER — Ambulatory Visit (HOSPITAL_COMMUNITY)

## 2025-03-04 ENCOUNTER — Ambulatory Visit (HOSPITAL_COMMUNITY)
